# Patient Record
Sex: Male | Born: 1965 | Race: White | Hispanic: No | Marital: Married
Health system: Midwestern US, Community
[De-identification: ages and names within clinical notes are randomized; demographics above are authoritative.]

## PROBLEM LIST (undated history)

## (undated) DIAGNOSIS — M25561 Pain in right knee: Secondary | ICD-10-CM

---

## 2019-09-23 IMAGING — CR XR CERVICAL SPINE COMPLETE 4-5 VIEWS
6 series · 6 of 6 positions shown · non-contrast
Comparison: none

EXAM:  CERVICAL SPINE:
CLINICAL INDICATION:  Cervicalgia
REFERENCE: [id number]

[left lateral (1 of 2)]
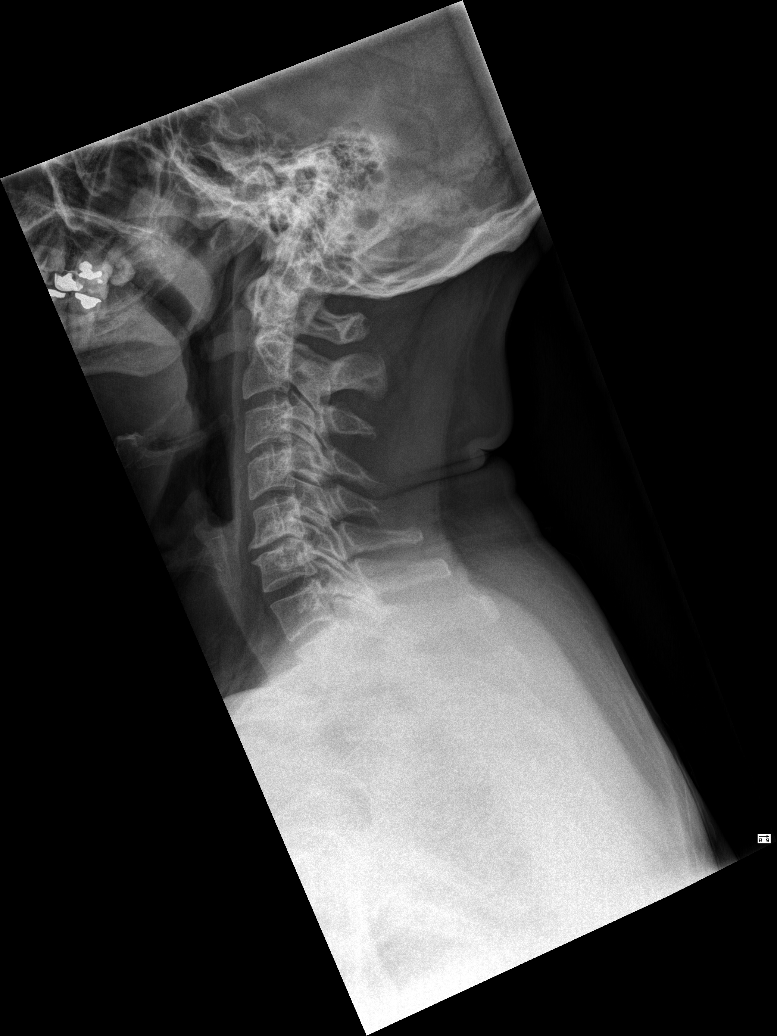

[left lateral (2 of 2)]
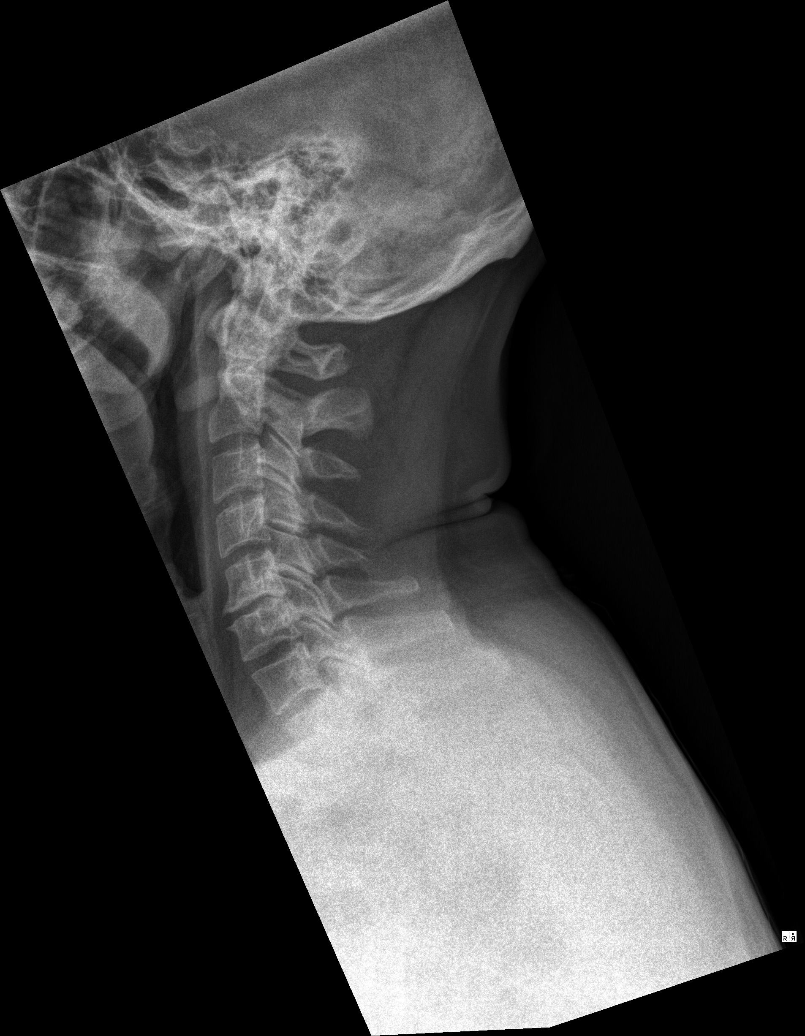

[AP (1 of 2)]
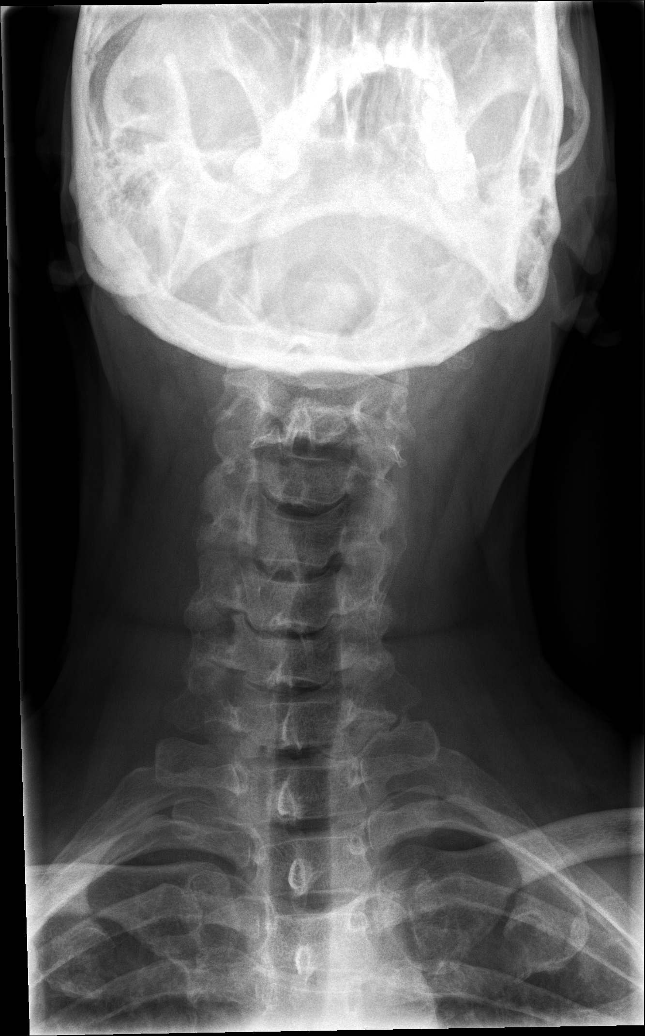

[rlo (1 of 2)]
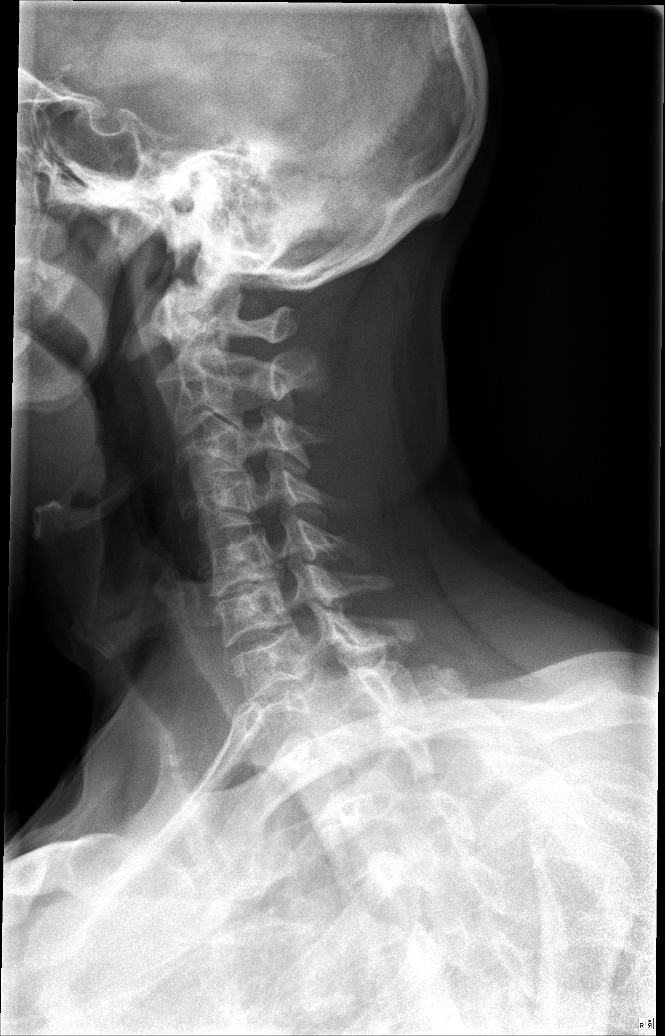

[rlo (2 of 2)]
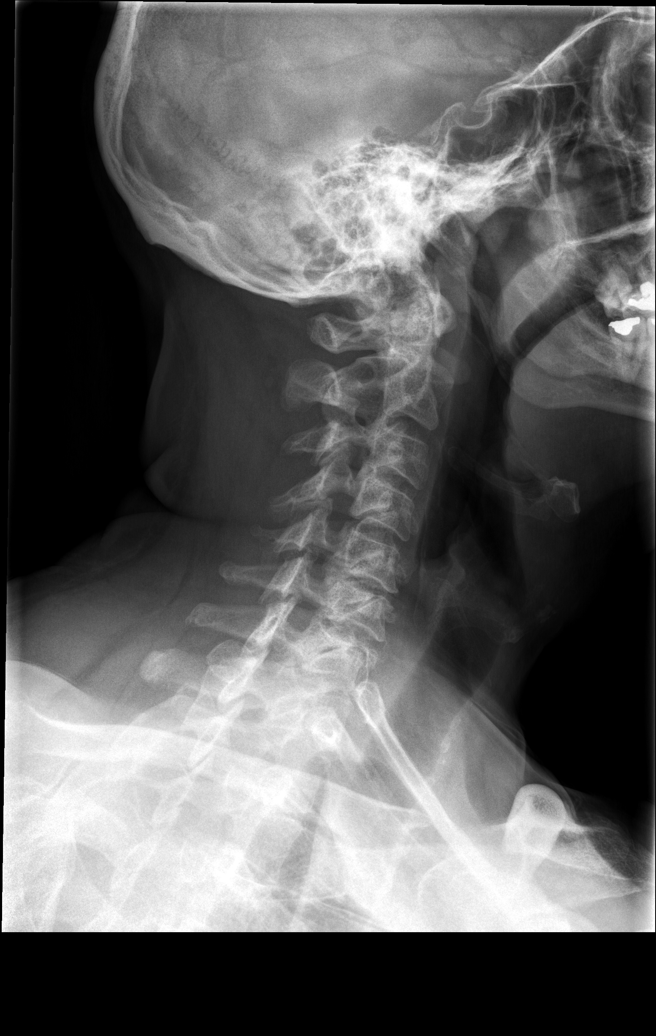

[AP (2 of 2)]
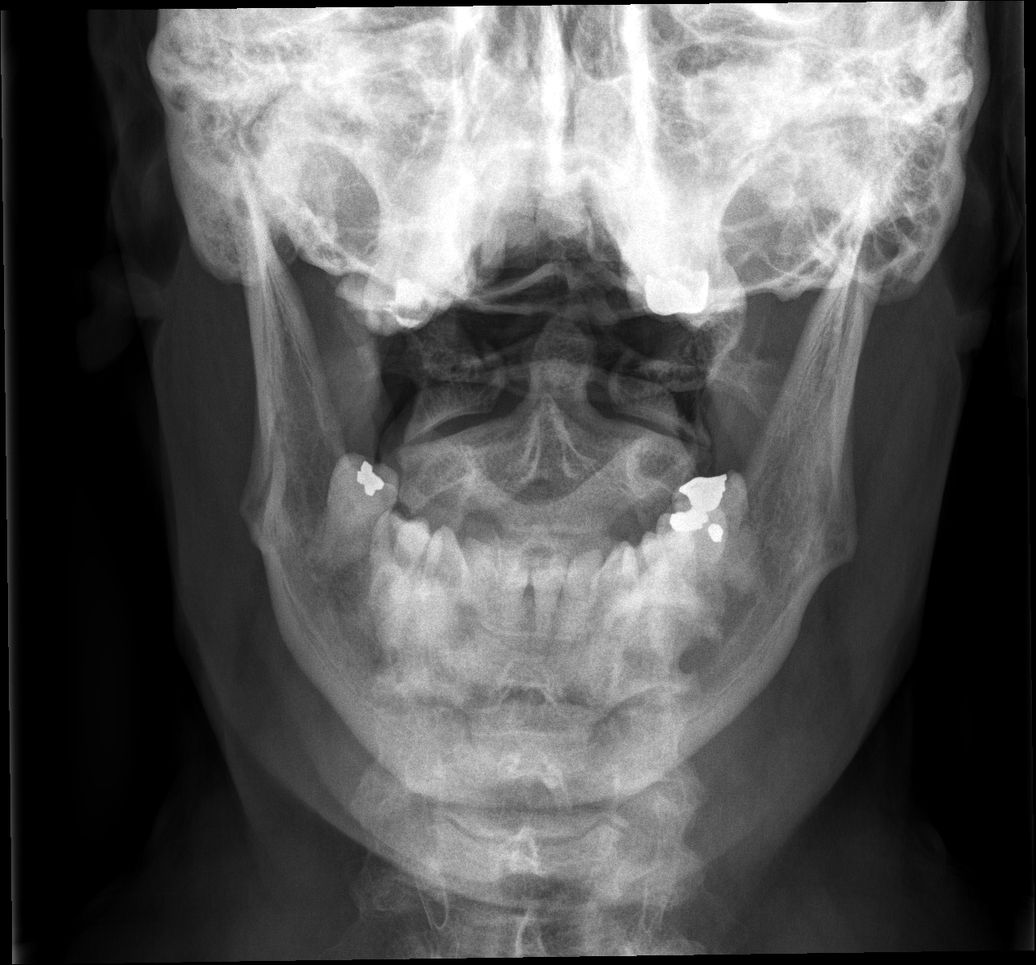

[6 of 6 positions shown; findings below may reference images not displayed]

FINDINGS: 5 Radiographs of the cervical spine demonstrate:
Mild cervicothoracic dextroscoliotic curvature. Mild to moderate C5-C6 disc height loss with osteophyte formation. Mild right greater than left C5-C6 osseous neural foraminal narrowing.
No acute fracture or traumatic listhesis is identified.  z
Prevertebral soft tissues are unremarkable with respect to size and contour.  The C1-C2 articulation is intact and symmetric.  The dens is unremarkable.
IMPRESSION: Degenerative changes with no superimposed acute process identified.  If symptoms persist, recommend correlation with cross-sectional imaging; CT or MRI.
LOCATION CODE: 1

## 2020-06-07 IMAGING — CR XR FOOT 3+ VIEWS LEFT
1 series · 4 of 4 positions shown · non-contrast
Comparison: none

EXAM:  LEFT FOOT:
CLINICAL INDICATION: pain

[Series 1: llo · left · 0.14mm/px · 4 of 4 slices shown]
[im 1/4]
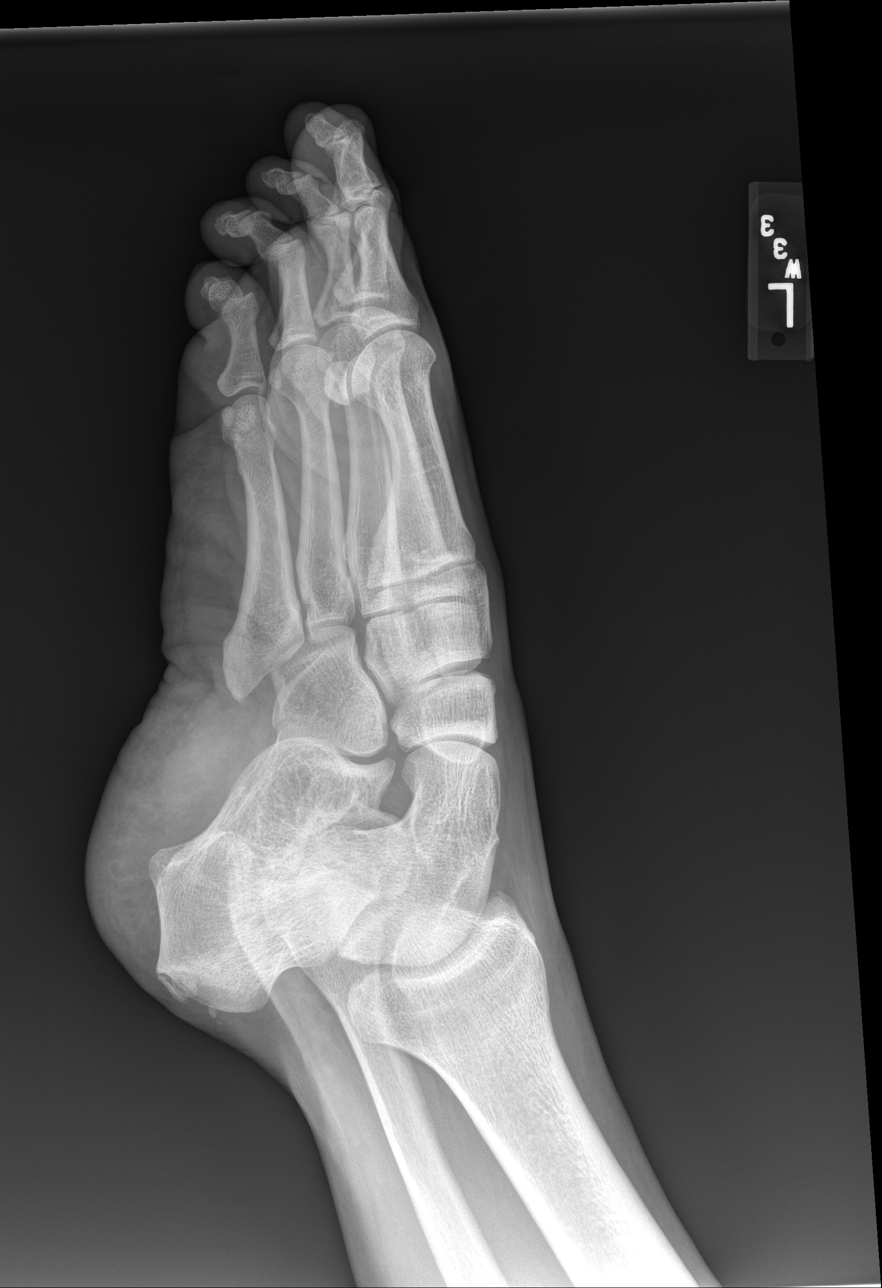
[im 2/4]
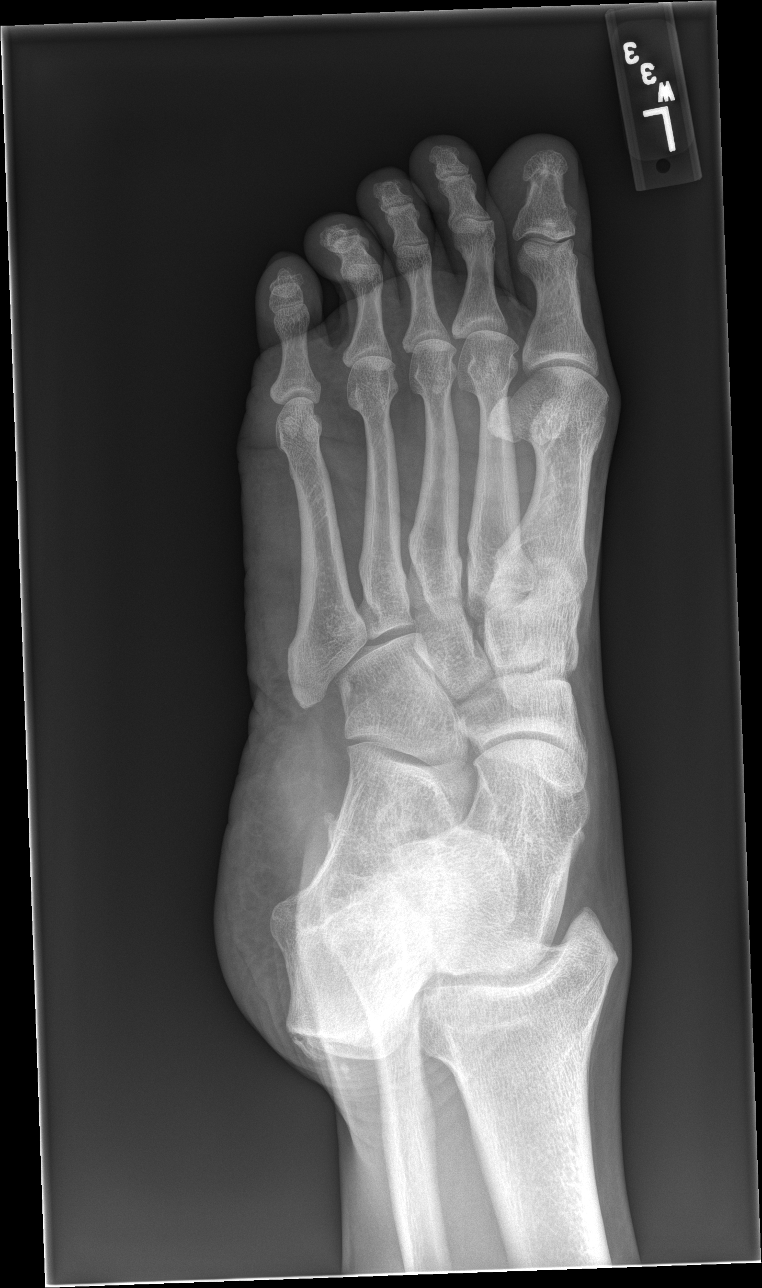
[im 3/4]
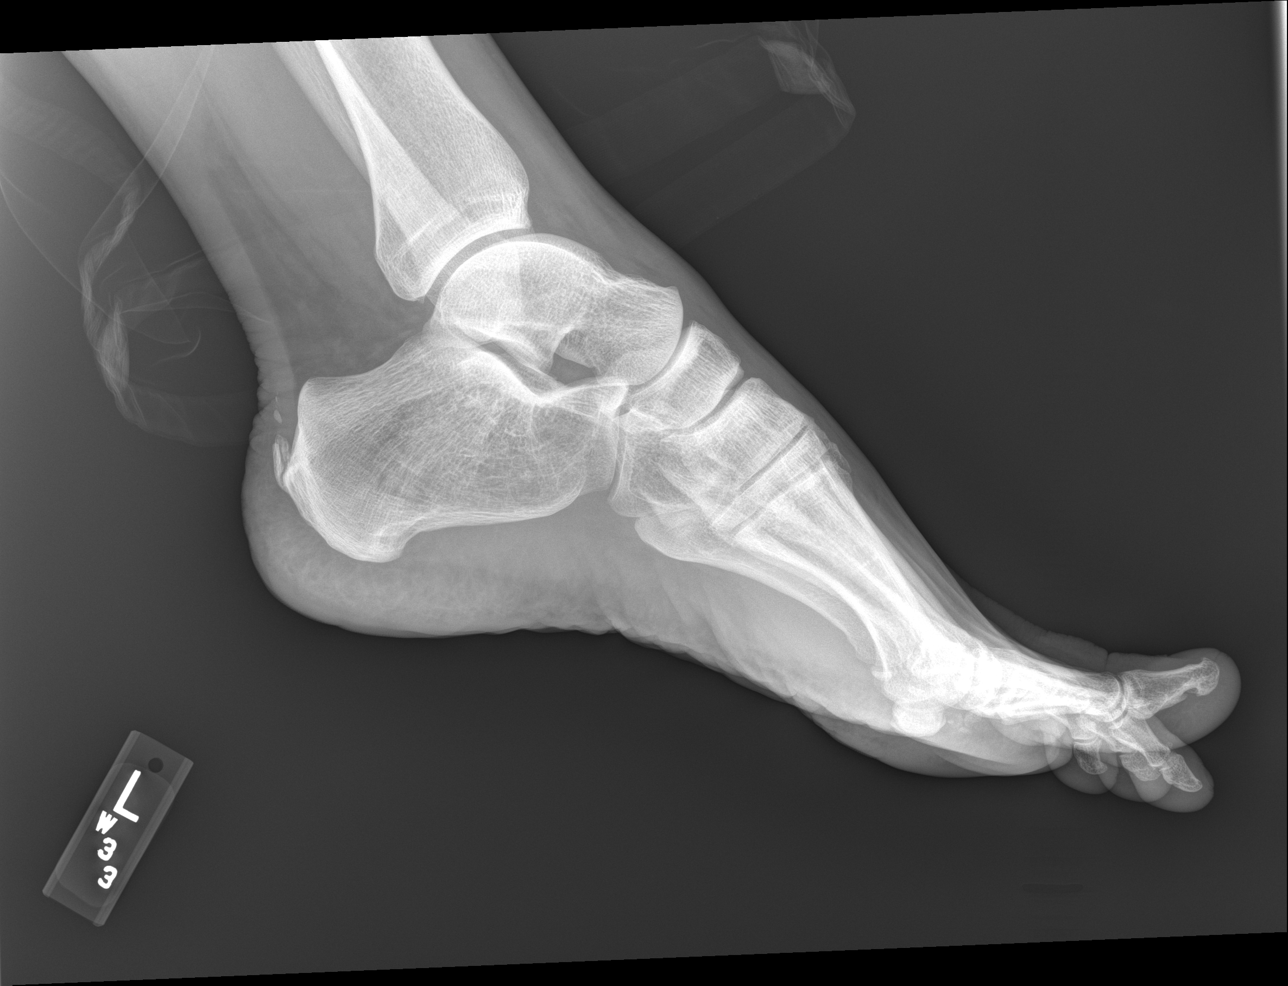
[im 4/4]
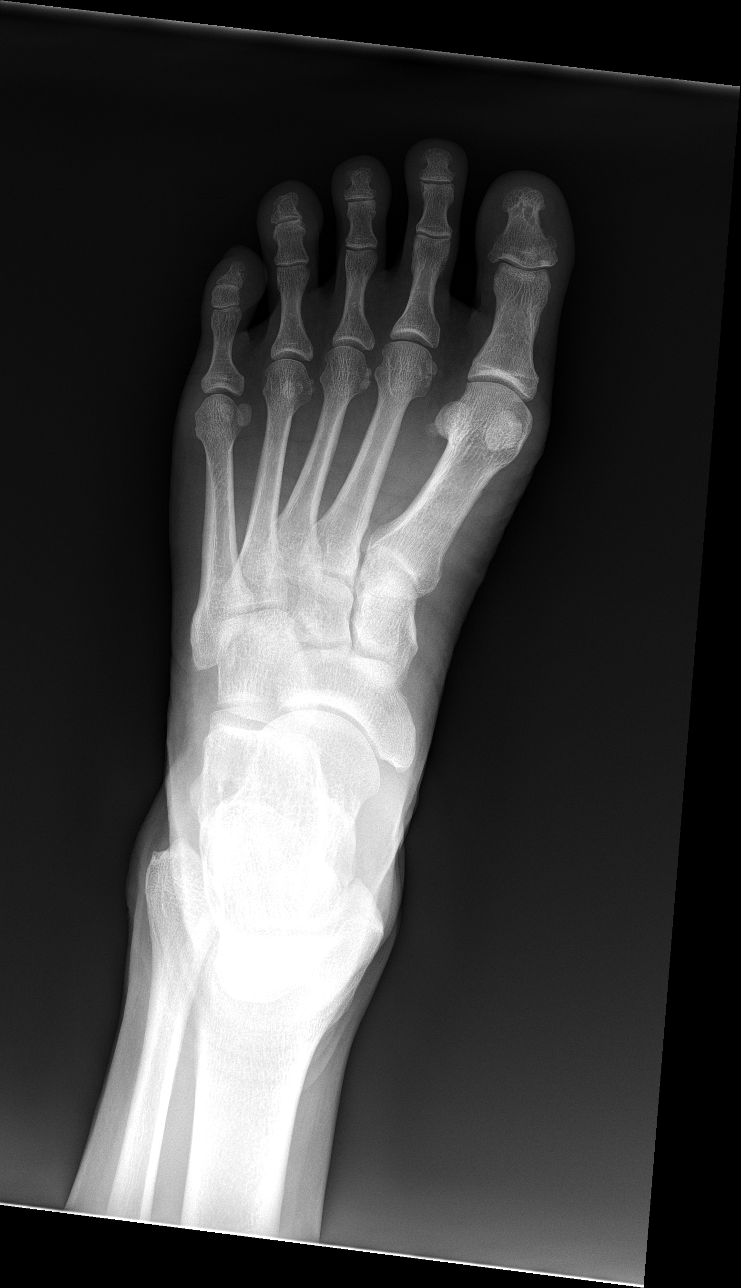

[4 of 4 positions shown; findings below may reference images not displayed]

FINDINGS: 3 radiographs of the Left foot demonstrate normally aligned and intact osseous structures.  No soft tissue abnormality or definite radiopaque foreign body is demonstrated. Posterior calcaneal spurring.
IMPRESSION: No acute findings detected.
LOCATION CODE: 1

## 2020-06-07 IMAGING — CR XR CALCANEUS 2 VIEWS LEFT
1 series · 2 of 2 positions shown · non-contrast
Comparison: none

Heel pain ,s/p cyst removal as child with donor bone
EXAM: LEFT CALCANEUS
CLINICAL INDICATION: pain

[Series 1: left lateral · left · 0.14mm/px · 2 of 2 slices shown]
[im 1/2]
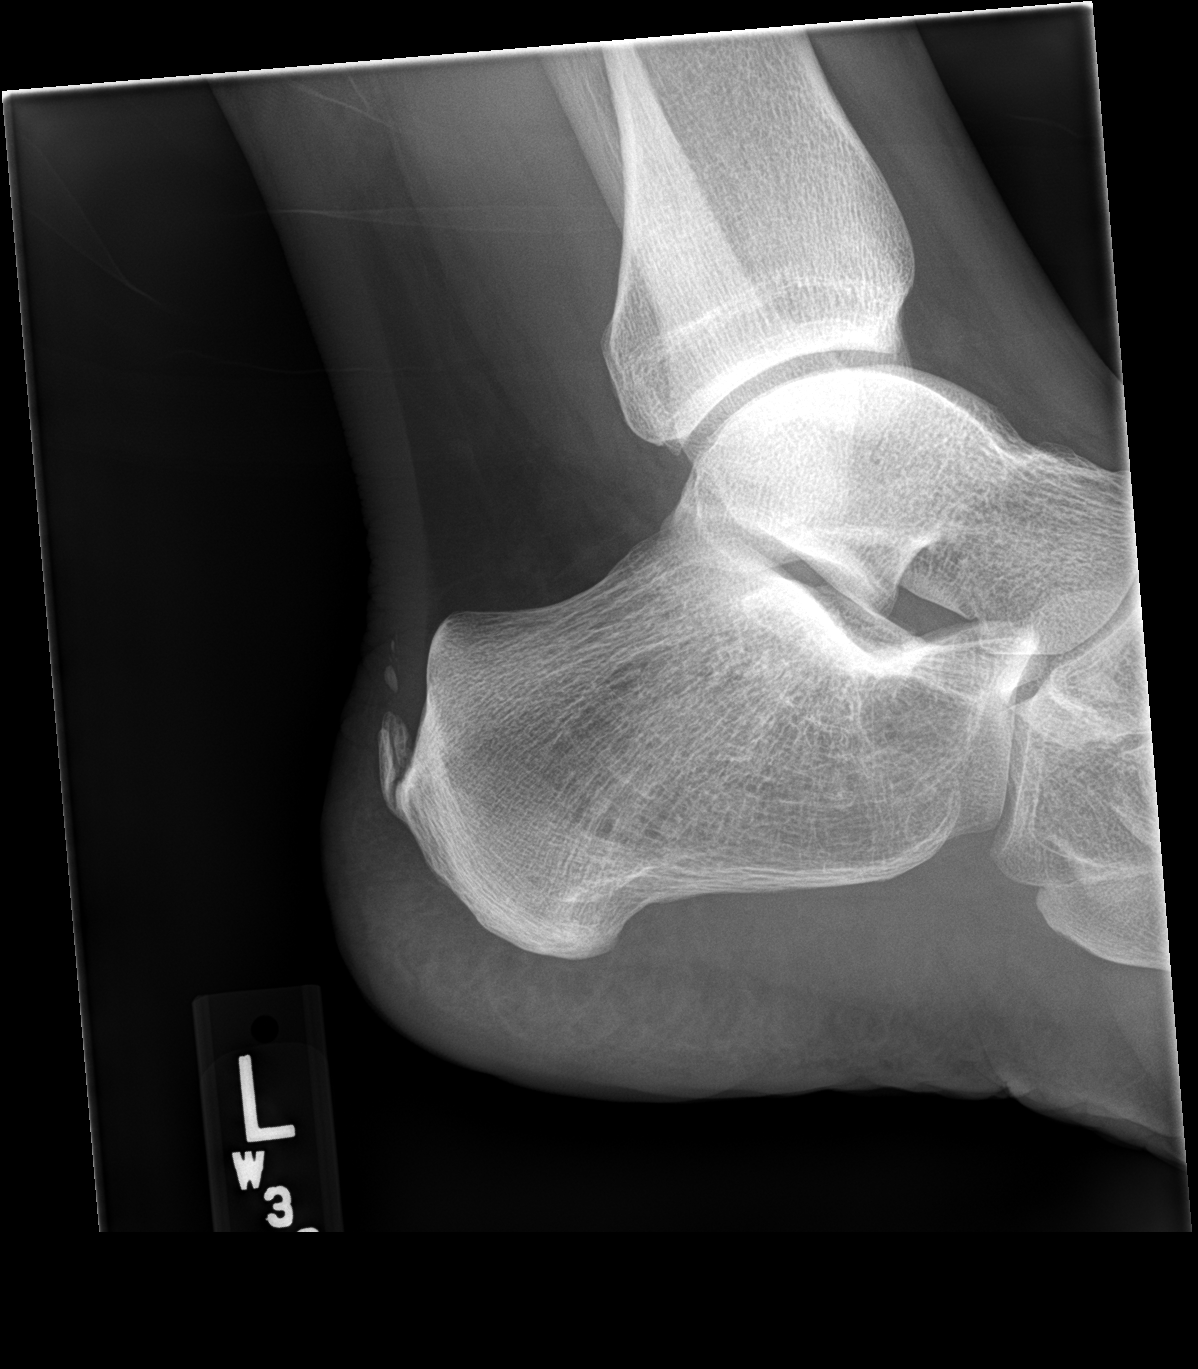
[im 2/2]
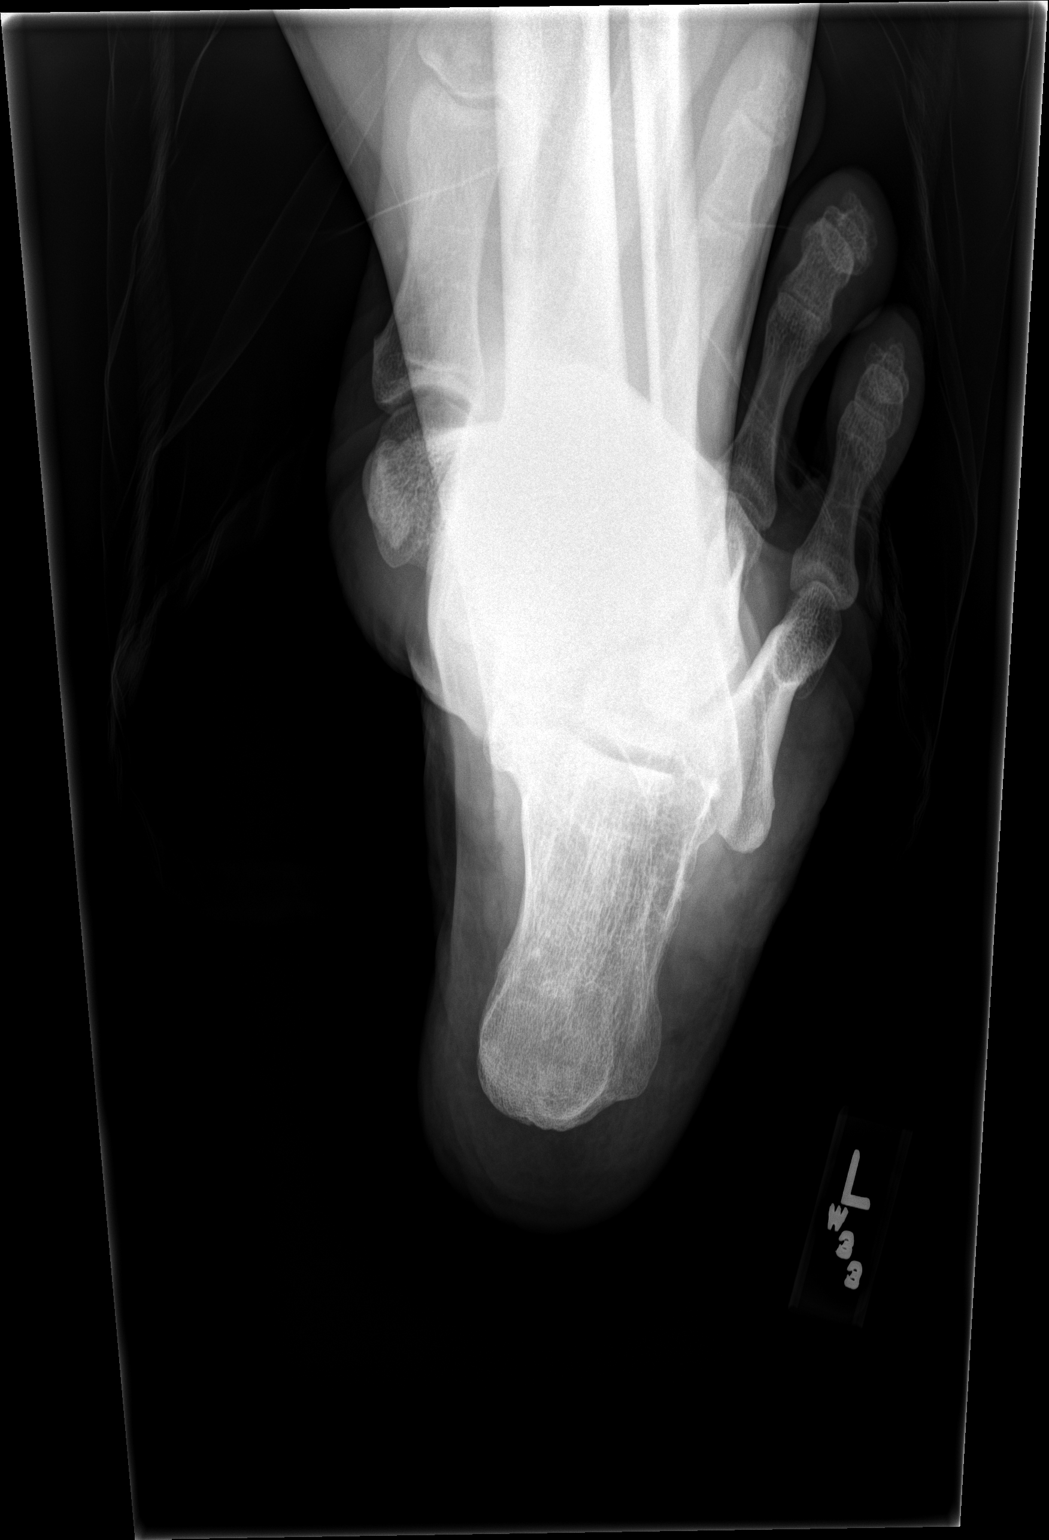

[2 of 2 positions shown; findings below may reference images not displayed]

FINDINGS: Two views of the calcaneus were obtained.  Bony structures appear intact and normally aligned.   No soft tissue gas or foreign body is identified. Posterior calcaneal spurring and calcification within the Achilles tendon.
IMPRESSION: No acute findings detected.
Posterior calcaneal spurring and calcification within the Achilles tendon concerning for chronic tendinitis.
LOCATION CODE: 1

## 2020-12-28 ENCOUNTER — Encounter

## 2020-12-29 ENCOUNTER — Encounter: Admit: 2020-12-29 | Discharge: 2020-12-29 | Payer: PRIVATE HEALTH INSURANCE | Primary: Sports Medicine

## 2020-12-29 ENCOUNTER — Ambulatory Visit
Admit: 2020-12-29 | Discharge: 2020-12-29 | Payer: PRIVATE HEALTH INSURANCE | Attending: Orthopaedic Surgery | Primary: Sports Medicine

## 2020-12-29 DIAGNOSIS — S83241A Other tear of medial meniscus, current injury, right knee, initial encounter: Secondary | ICD-10-CM

## 2020-12-29 NOTE — Progress Notes (Signed)
[facility]  [facility] [place]   [address]  [address]  [place] [address]  Dept: [phone]  Loc: [phone]        FEDERICK LEVENE  July 01, 1965  [id number]  12/29/2020      Chief Complaint   Patient presents with    New Patient     Right knee pain       HPI:  Nathaniel Collins is a 55 y.o. male who is here today for evaluation of his right knee. Their symptoms started  about 4 months ago.  The current symptoms started after no specific injury, noted pain the day after running on the treadmill.  He reports he had a similar issue on his left side but resolved after 1 month.  He reports that he did not run for 6 months prior to starting the treadmill in April which may have contributed to it.  He reports that most of his activities are getting better other than trialing running.    The patient complains of pain located diffuse, stiffness, and swelling.    Activities that make the pain worse include running and driving . The patient has tried rest/ice/elevation/activity modification with some relief. The patient reports no previous surgery related to the injured knee.     The patient is retired.    Tico is referred by Reatha Harps, MD.     SANE score: 60    No Known Allergies  Current Outpatient Medications   Medication Sig Dispense Refill    quinapril (ACCUPRIL) 10 MG tablet TAKE 1 TABLET BY MOUTH EVERY DAY FOR 90 DAYS       No current facility-administered medications for this visit.     Past Medical History:   Diagnosis Date    High blood pressure      History reviewed. No pertinent surgical history.  Social History     Socioeconomic History    Marital status: Married     Spouse name: Not on file    Number of children: Not on file    Years of education: Not on file    Highest education level: Not on file   Occupational History    Not on file   Tobacco Use    Smoking status: Never    Smokeless tobacco: Never   Substance and Sexual Activity    Alcohol use: Not on  file    Drug use: Not on file    Sexual activity: Not on file   Other Topics Concern    Not on file   Social History Narrative    Not on file     Social Determinants of Health     Financial Resource Strain: Not on file   Food Insecurity: Not on file   Transportation Needs: Not on file   Physical Activity: Not on file   Stress: Not on file   Social Connections: Not on file   Intimate Partner Violence: Not on file   Housing Stability: Not on file     History reviewed. No pertinent family history.      Physical Exam:    BP 136/88    Temp 97.4 ??F (36.3 ??C)    Ht 6\' 3"  (1.905 m)    Wt 252 lb (114.3 kg)    BMI 31.50 kg/m??     MUSCULOSKELETAL:  Hip Exam:  Ipsilateral hip exam reveals normal and symmetric range of motion with no pain elicited with internal  and external range of  motion.    Lumbar Exam:  Full painless flexion, extension, and rotation with negative same side and contralateral straight leg raise.     Right Knee:  Palpation reveals tenderness over the medial joint line, lateral joint line, and patellar tendon  Non tender with palpation over the remaining bony prominences or palpable structures around the knee.  Negative homans sign at the calf.     Patellofemoral exam reveals mild crepitus with patellar compression and knee ROM and no significant pain or apprehension with patellar compression or manipulation    Inspection of the knee reveals no obvious deformity or swelling, no obvious skin lesion, erythema, or discoloration.  Mild effusion. Neutral alignment. Scars include none.      ROM:   RIGHT  LEFT   Extension AROM Full  PROM Same AROM Full  PROM Same   Flexion AROM 130??  PROM Same AROM 130??  PROM Same       Sensation:  Sensation intact over the medial and anterior thigh, medial and posterolateral calf, dorsal and plantar foot.     Pulse:  Palpable DP pulse.      Reflexes:  2+ patellar and achilles reflexes.     Motor:  Motor is 5/5 with hip flexion, thigh adduction, knee extension, flexion, ankle dorsi-  and plantarflexion and toe extension flexion without evidence of quadricep atrophy compared to the contralateral side.    Ligamentous Examination:  ACL testing reveals negative lachman with firm endpoint. Negative pivot shift.  PCL testing reveals negative posterior drawer testing.    MCL testing reveals negative valgus stress with firm endpoint at 0 and 30 degrees.   LCL testing reveals negative varus stress with firm endpoint at 0 and 30 degrees.    PLC (Posterio-lateral corner) testing reveals negative varus stress with firm endpoint at 0 and 30 degrees.     Special Tests (as indicated):  Pain with hyperextension: negative  Pain with hyperflexion: negative    McMurray's test: No significant pain elicited with mcmurray testing        Thessaly test (Standing wt-bearing pivoting): positive                    The contralateral knee is negative for any pertinent positives and was examined for a baseline to compare to the injured side.       Imaging:    Knee Xrays:   Plain films were taken today and reviewed in office.  Weight bearing 4 views including AP, and PA flex views of bilateral knees, lateral and sunrise views of the affected knee(s) show narrowing in the patellofemoral compartment, but sparing the medial and lateral compartments without associated sclerosis and osteophyte formation in affected compartments noted above. No evidence of fracture or other osseous abnormality. No evidence of obvious malignant bony lesions.     Other Diagnostic Testing:   None      IMPRESSION:      ICD-10-CM    1. Tear of medial meniscus of right knee, current, initial encounter  S83.241A       2. Arthritis of right knee  M17.11           PROCEDURE:    None    PLAN:  The patients history, exam, and imaging is consistent with right knee pain likely secondary to mild patellofemoral arthritis or potential small medial meniscus tear.  He reports that he started having pain in April and has resolved for the most part other  than with  running.  He typically runs for 5 to 25 minutes on a treadmill.  He reports his location of pain can somewhat move around.  He does have some mild medial joint tenderness and positive Thessaly on exam today.  I went over typical treatment options for this.  As he is feeling somewhat better, we agreed on trialing gradual increase in activities and potentially even crosstraining with activities other than running.  If he has worsening issues like he did and April, he will call and we will likely discuss potential cortisone injection versus MRI.  All his questions were answered otherwise.    I discussed with Brodey the natural history, expected outcome, and risks/benefits of both operative and nonoperative management of his particular diagnosis relative to his age, activity level, previous treatment, and physical exam.  Toshiro had some excellent questions, all of which were answered to his satisfaction.    Keanu elected to proceed with gradual increase in activities.    Follow-up:  Sargon will followup with me on an as needed basis.  He  knows to call the office with any questions or concerns in the interim.    Future Imaging:  NONE        This encounter was considered low MDM due to 1 acute uncomplicated injury.  I recommended tylenol or NSAIDs as needed for pain or swelling unless they have been instructed to avoid previously.       Jefm Bryant, MD  Orthopaedic Sports Medicine  [facility]  Department of Orthopaedics and Sports Medicine  12/29/2020 at 3:34 PM     (Please note that portions of this note may have been completed with a voice recognition program. Efforts were made to edit the dictations but occasionally words are mis-transcribed.)

## 2021-02-22 IMAGING — MR MRI BRAIN WITH AND WITHOUT CONTRAST
11 of 13 series · 38 of 48 positions shown · non-contrast
Comparison: None available

HEADACHES THAT START IN THE NECK AND GO UP TO HEAD
FINAL REPORT:
INDICATION: Chronic tension-type headache, not intractable Pain
Exam: MRI of the head with intravenous contrast.

[Series 2001: survey_mpr_sag · sagittal · 1.6mm · 1.60mm/px · 1 of 5 slices shown]
[im 1/5]
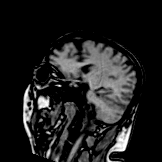

[Series 3001: survey_mpr_cor · coronal · 1.6mm · 1.60mm/px · 1 of 3 slices shown]
[im 1/3]
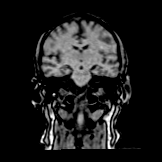

[Series 4001: survey_mpr_tra · axial · 1.6mm · 1.60mm/px · 1 of 3 slices shown]
[im 1/3]
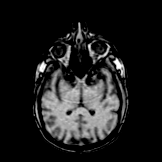

[Series 5001: T1 · sagittal · 5.0mm · 0.72mm/px · 3 of 24 slices shown (1 of 2)]
[im 1/24]
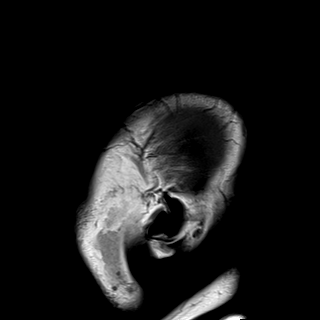
[im 12/24]
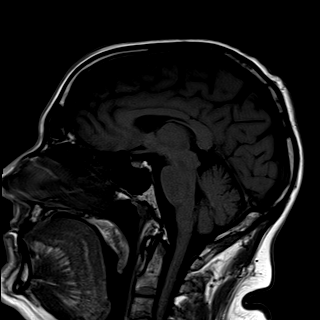
[im 24/24]
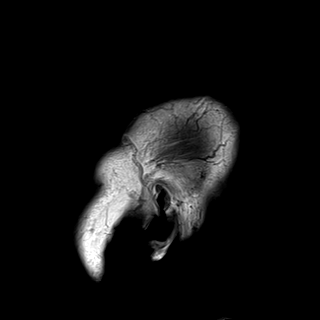

[Series 6002: ax dwi_tracew · axial · 4.0mm · 0.60mm/px · z∈[-91,+13]mm · 3 of 33 slices shown]
[im 1/33]
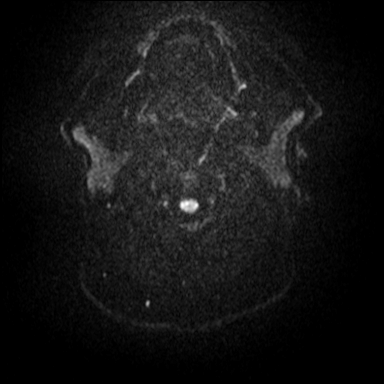
[im 11/33]
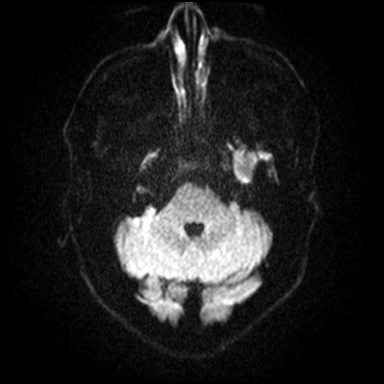
[im 22/33]
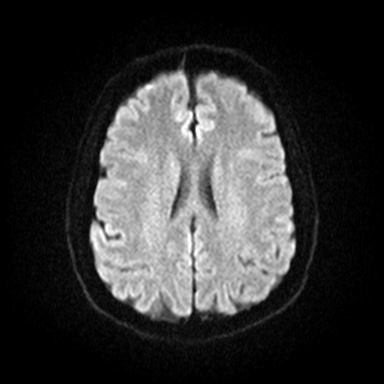

[Series 9001: T2 · axial · 4.0mm · 0.53mm/px · z∈[-91,+67]mm · 5 of 33 slices shown]
[im 1/33]
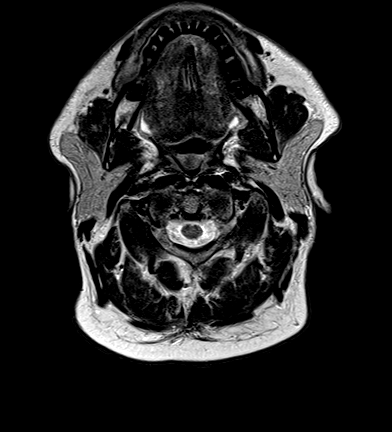
[im 9/33]
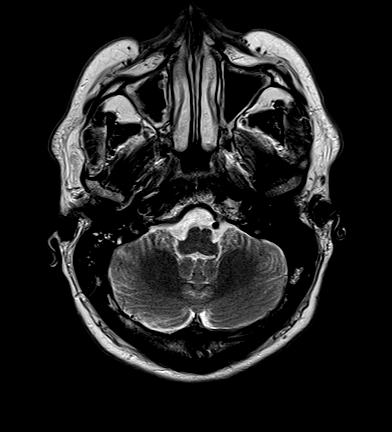
[im 17/33]
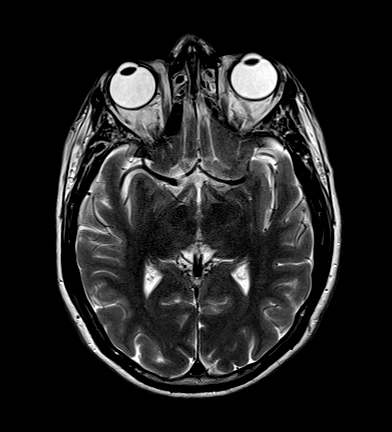
[im 25/33]
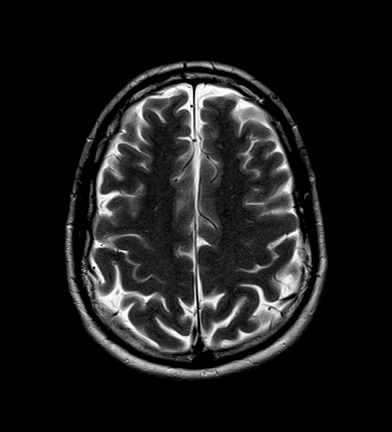
[im 33/33]
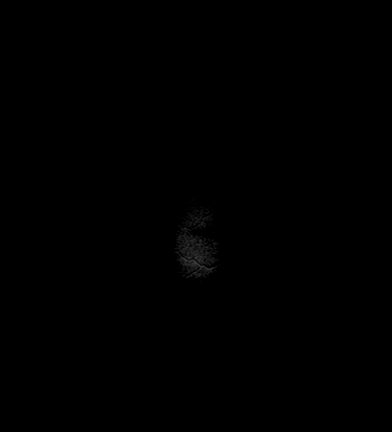

[T1 · axial · 4.0mm · 0.72mm/px · z∈[-91,+67]mm · 5 of 33 slices shown (2 of 2)]
[im 1/33]
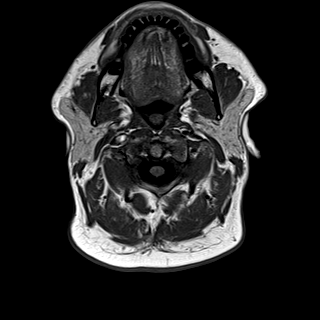
[im 9/33]
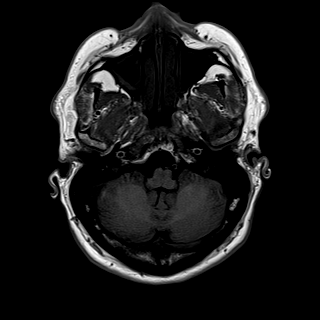
[im 17/33]
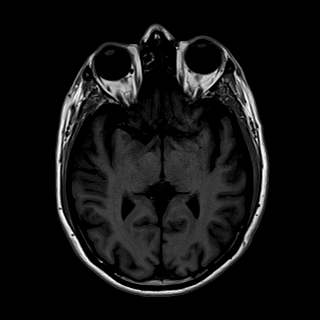
[im 25/33]
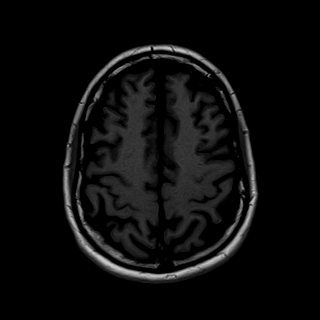
[im 33/33]
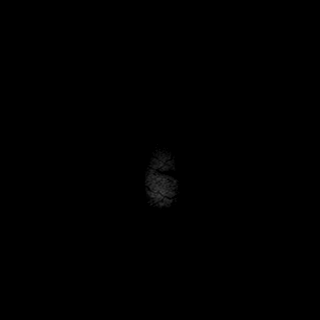

[FLAIR · axial · 4.0mm · 0.72mm/px · z∈[-91,+67]mm · 5 of 33 slices shown]
[im 1/33]
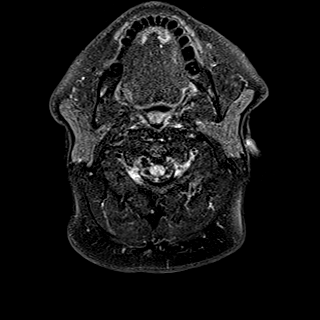
[im 9/33]
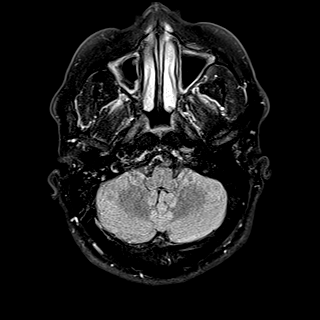
[im 17/33]
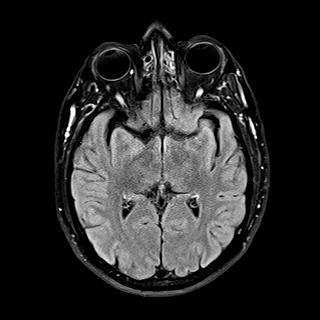
[im 25/33]
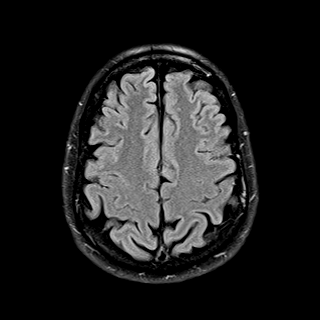
[im 33/33]
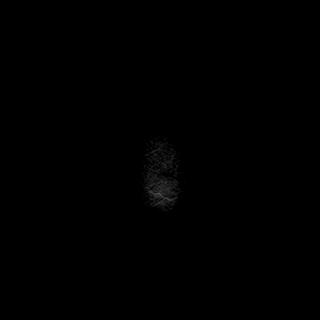

[GRE · axial · 4.0mm · 0.45mm/px · z∈[-91,+67]mm · 5 of 33 slices shown]
[im 1/33]
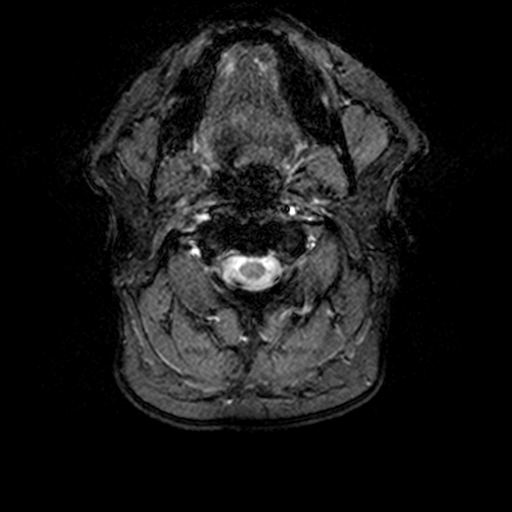
[im 9/33]
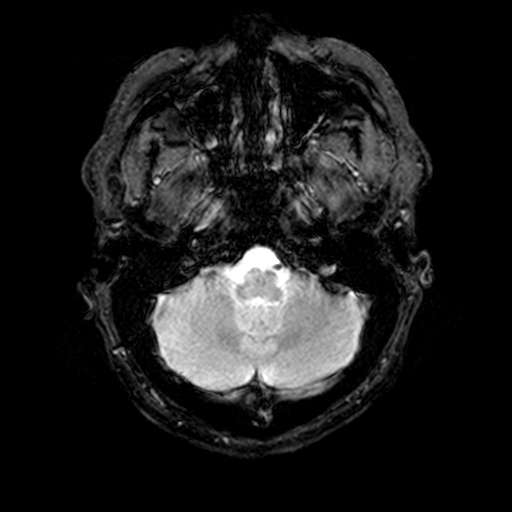
[im 17/33]
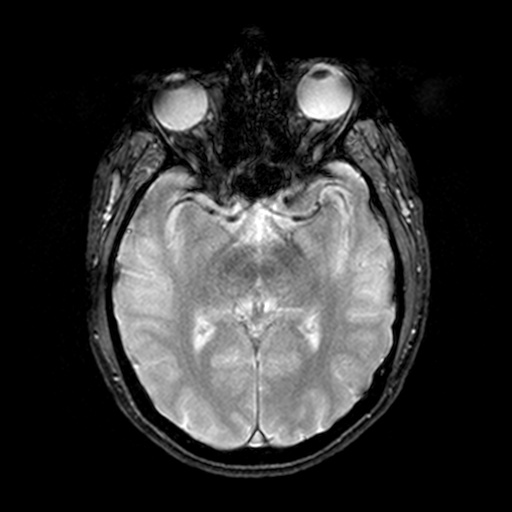
[im 25/33]
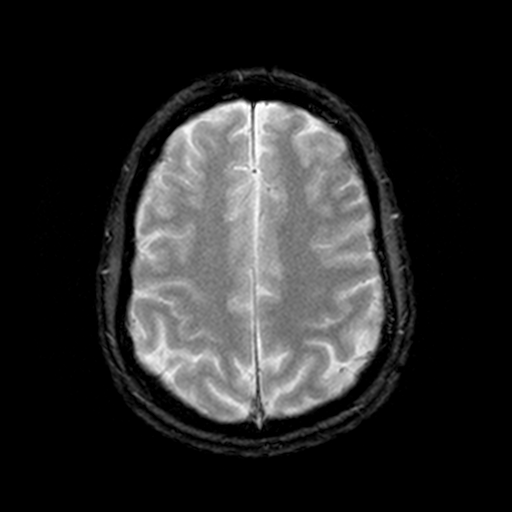
[im 33/33]
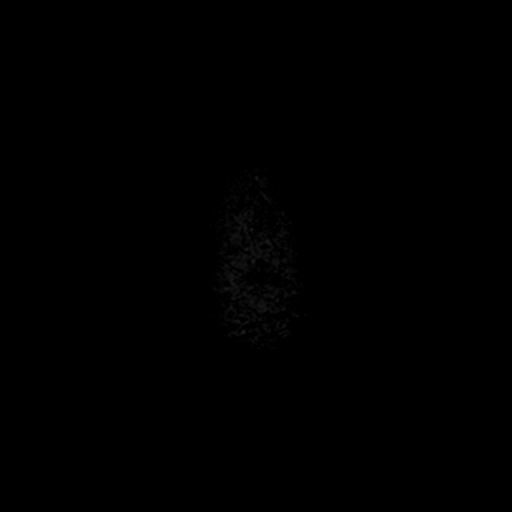

[T1 post-contrast · axial · 4.0mm · 0.72mm/px · z∈[-91,+67]mm · 5 of 33 slices shown (1 of 2)]
[im 1/33]
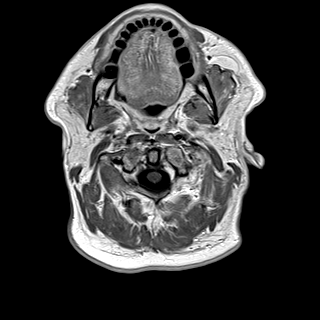
[im 9/33]
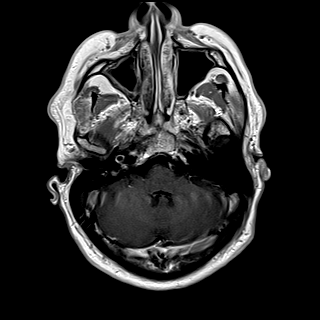
[im 17/33]
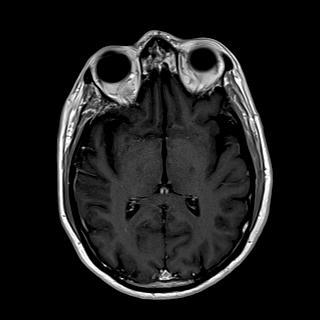
[im 25/33]
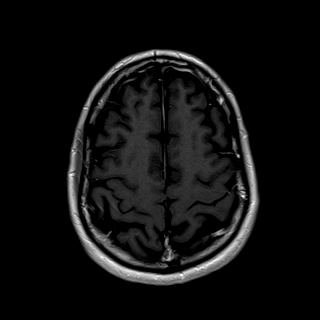
[im 33/33]
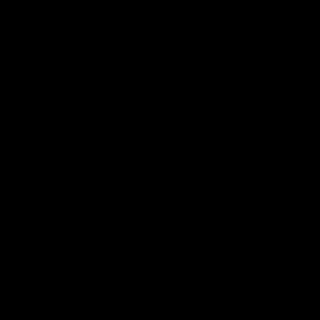

[T1 post-contrast · coronal · 5.0mm · 0.72mm/px · 4 of 28 slices shown (2 of 2)]
[im 1/28]
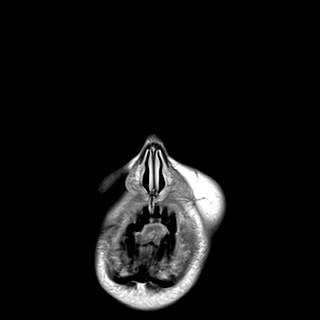
[im 10/28]
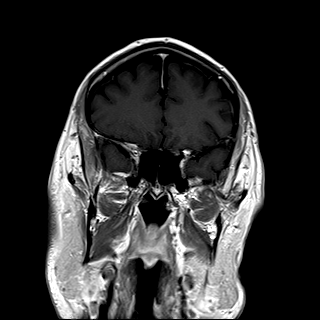
[im 19/28]
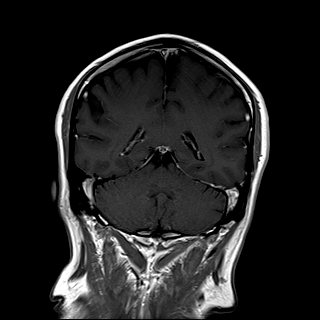
[im 28/28]
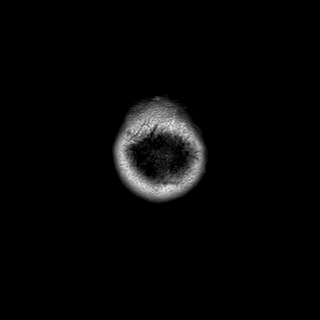

[38 of 48 positions shown; findings below may reference images not displayed]

FINDINGS: No midline shift. Basal cisterns are patent.  Ventricles are normal and symmetric for patient's age.
No evidence for acute infarct, intracranial hemorrhage or mass lesion. No extra-axial fluid collections.
No abnormal enhancing lesions following contrast administration.
Intracranial vascular flow voids are normal.
Orbital contents are unremarkable.
There is mucosal thickening of bilateral maxillary sinuses, the right frontal sinus, and partial opacification of the bilateral ethmoid air cells.
IMPRESSION: 1.  No acute intracranial abnormality.
2.  Paranasal sinus disease, nonspecific but could be seen in sinusitis.

## 2022-02-07 IMAGING — DX XR CHEST 1 VIEW
1 series · 2 of 2 positions shown · non-contrast
Comparison: 06/18/2017

CHEST PAIN
FINAL REPORT:
XR CHEST 1 VIEW
INDICATION: Chest pain.
TECHNIQUE: AP views of the chest.

[Series 6050: AP · U · 0.10mm/px · 2 of 2 slices shown]
[im 1/2]
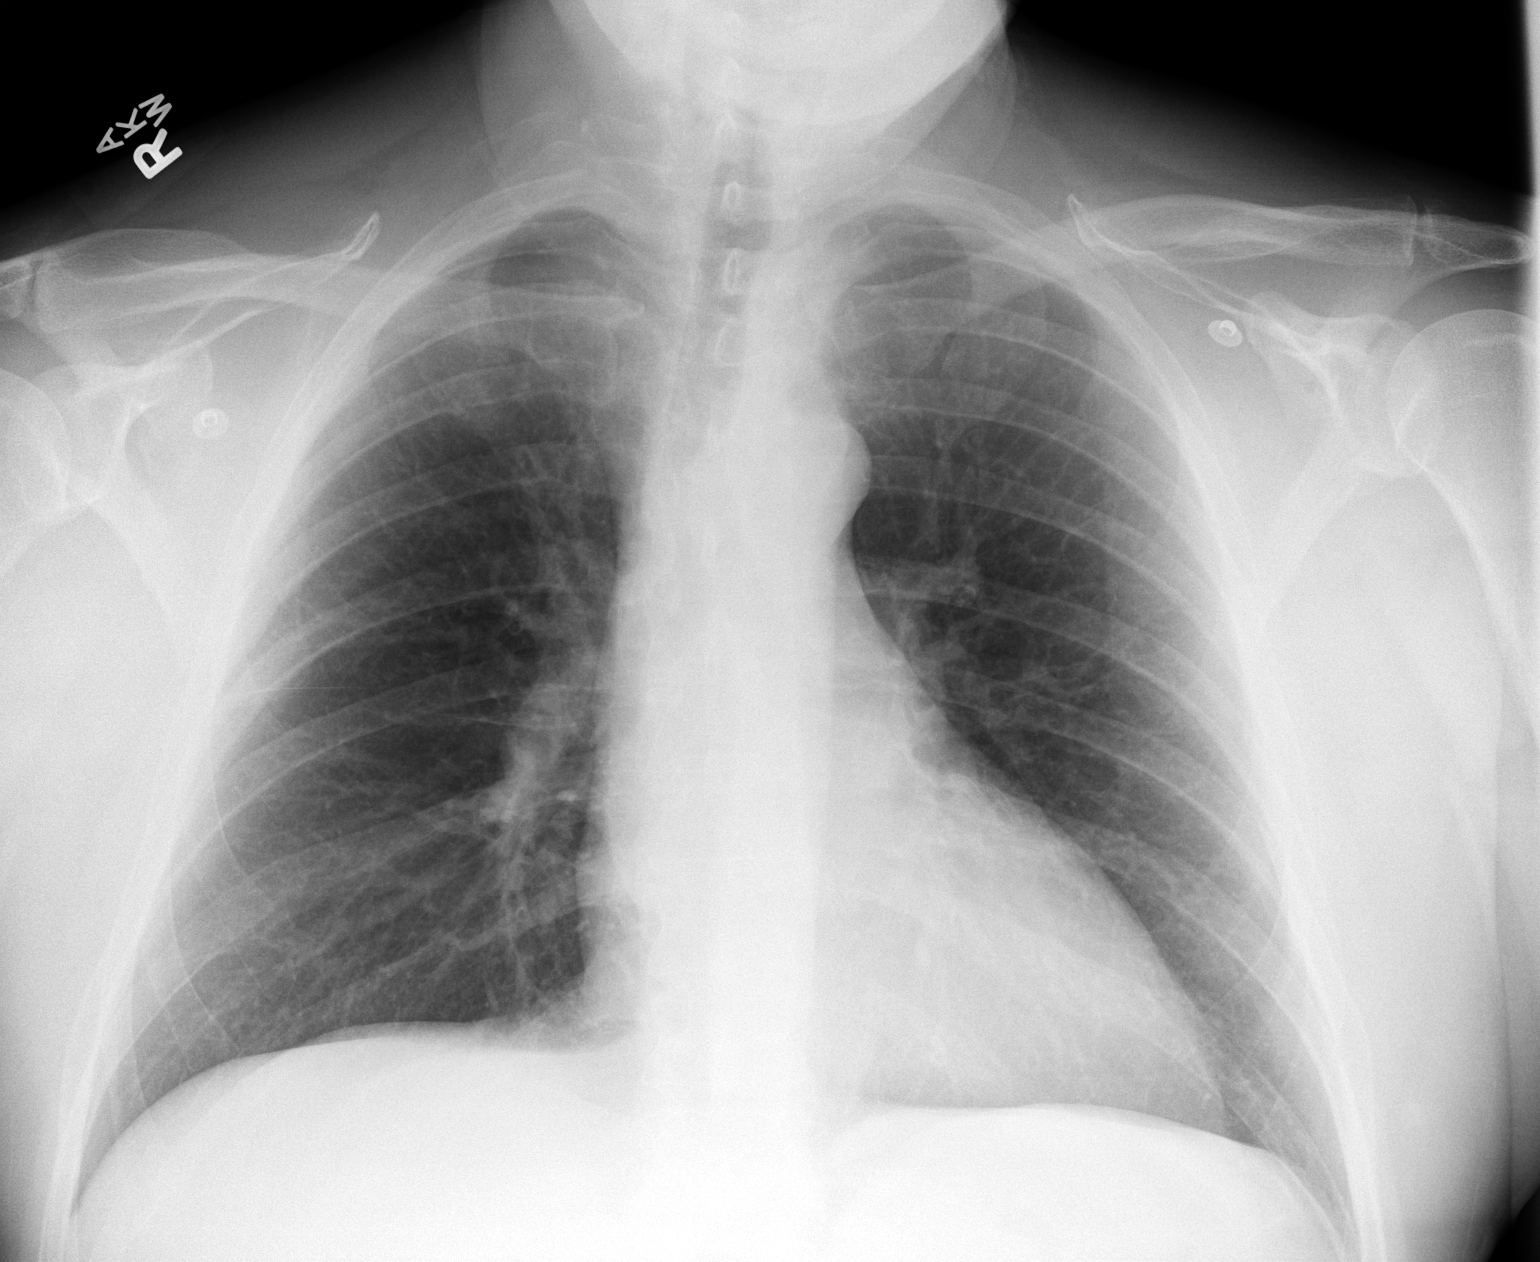
[im 2/2]
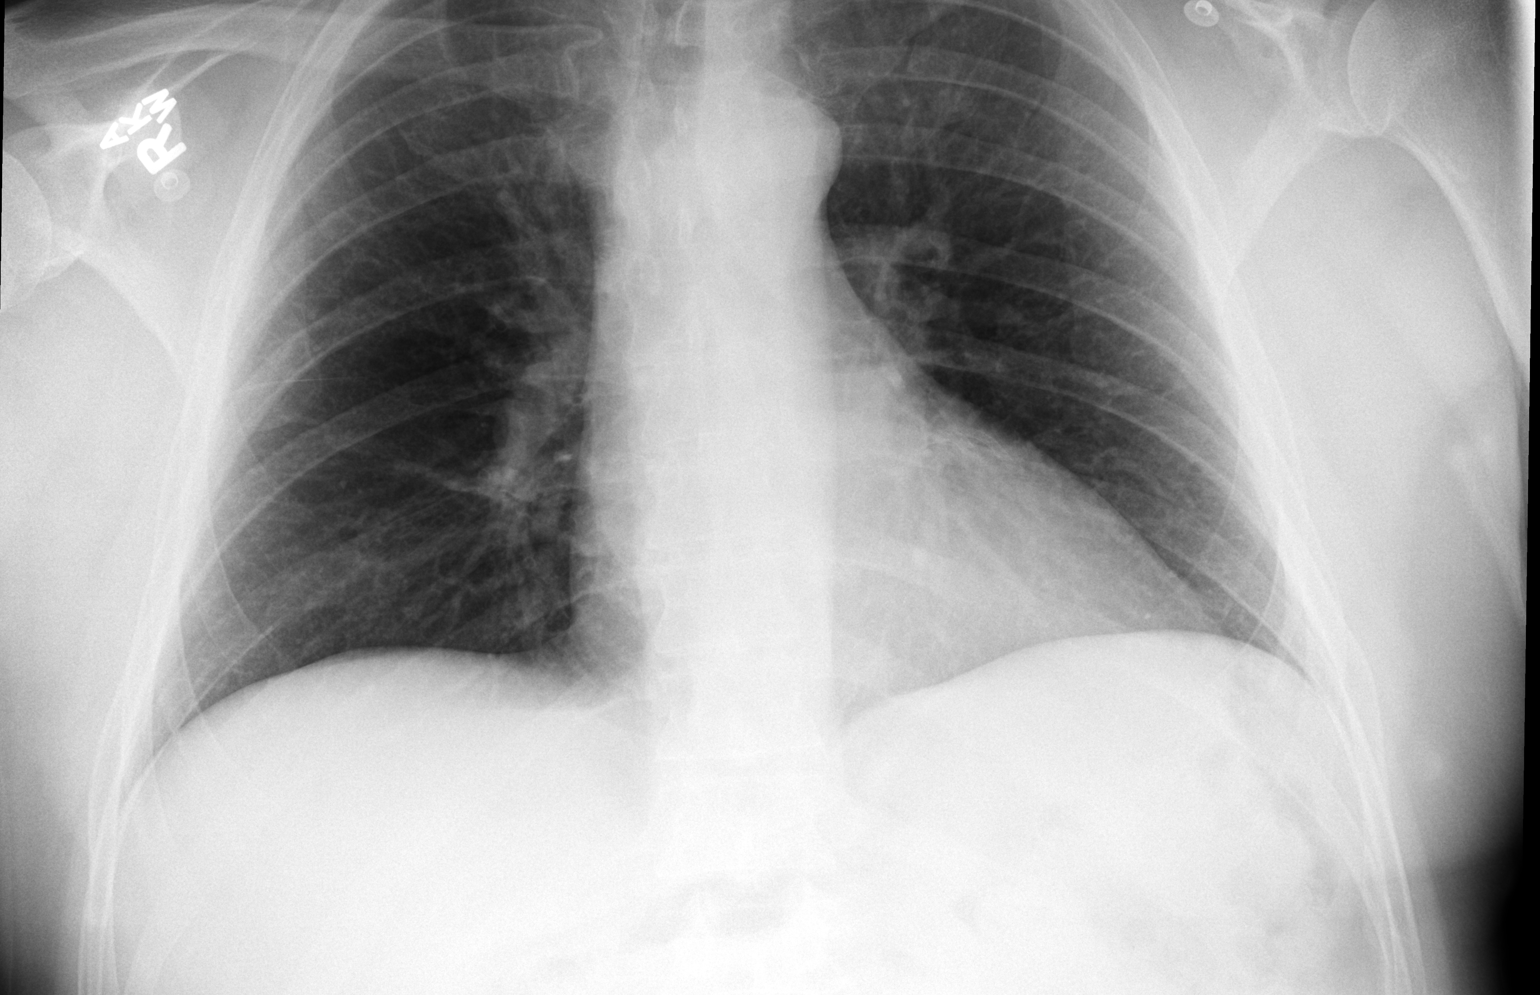

[2 of 2 positions shown; findings below may reference images not displayed]

FINDINGS: The cardiac silhouette is within normal limits. The mediastinum is in the midline. No consolidation or pulmonary edema. The costophrenic angles are sharp. No pneumothorax. The bones and soft tissues are unremarkable.
IMPRESSION: 
IMPRESSION: No radiographic evidence of an acute cardiopulmonary process.

## 2022-02-17 IMAGING — MR MRI CERVICAL SPINE WITHOUT CONTRAST
7 series · 48 of 48 positions shown · non-contrast
Comparison: None available

NECK AND SHOULDER PAIN
FINAL REPORT:
HISTORY: Neck and shoulder pain
Procedure: MRI of the cervical spine without contrast
TECHNIQUE: Multisequence, multiplanar imaging of the cervical spine was performed.

[Series 7001: survey_mpr_sag · sagittal · 1.7mm · 1.67mm/px · 5 of 15 slices shown]
[im 1/15]
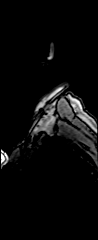
[im 4/15]
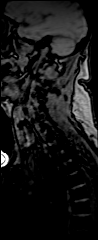
[im 8/15]
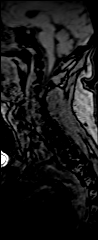
[im 11/15]
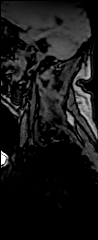
[im 15/15]
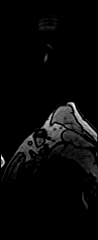

[Series 8001: survey_mpr_(person_name) · axial · 1.7mm · 1.67mm/px · z∈[-328,-28]mm · 2 of 7 slices shown]
[im 1/7]
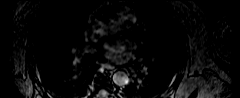
[im 7/7]
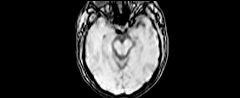

[Series 9001: T2 · sagittal · 3.0mm · 0.38mm/px · 5 of 15 slices shown (1 of 2)]
[im 1/15]
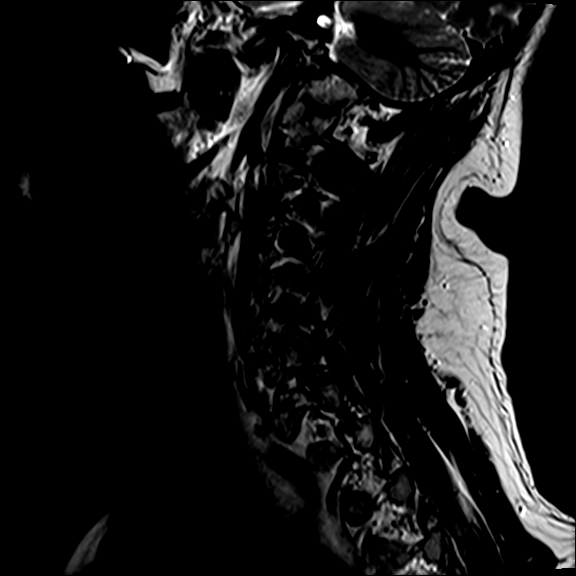
[im 4/15]
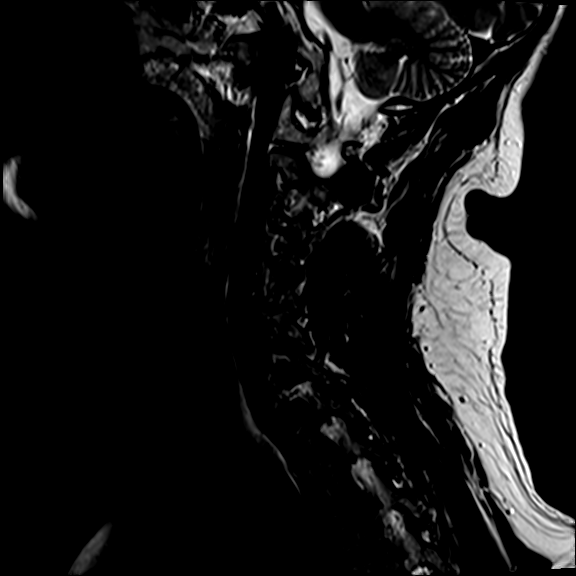
[im 8/15]
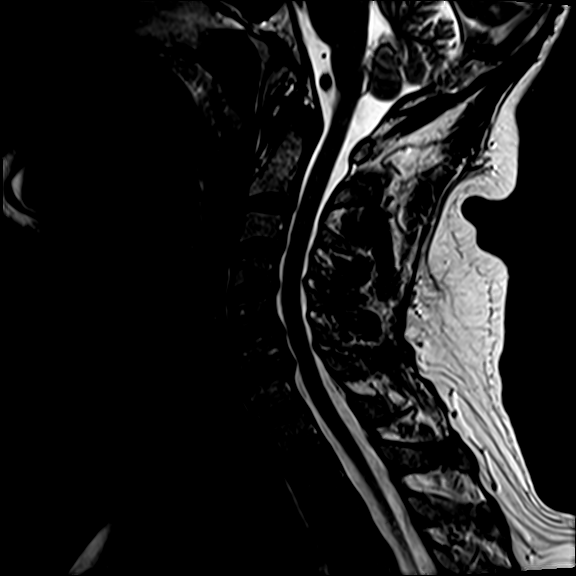
[im 11/15]
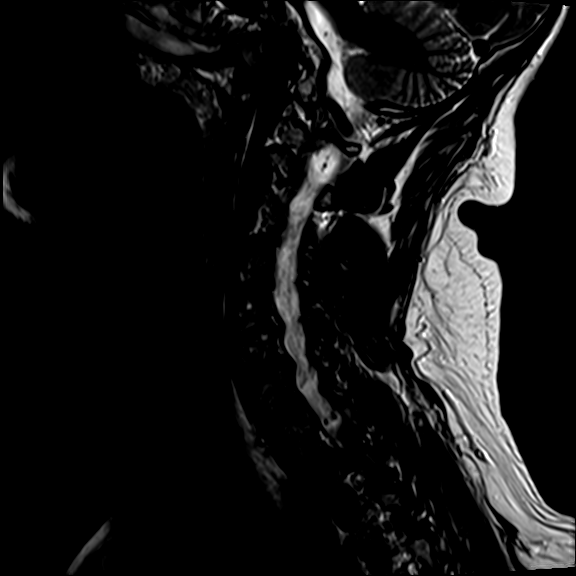
[im 15/15]
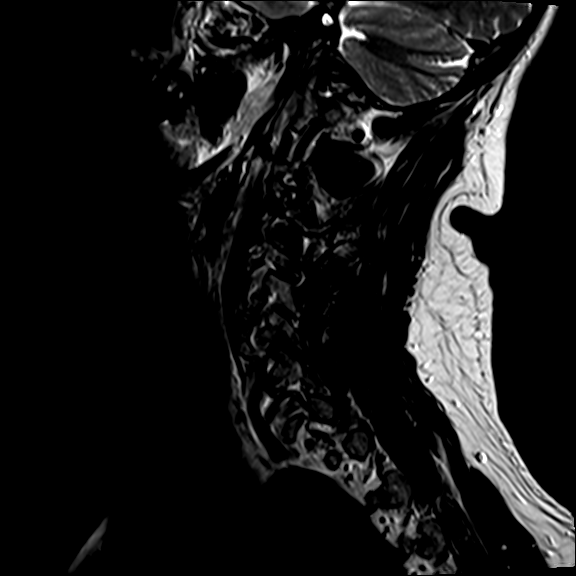

[T1 · sagittal · 3.0mm · 0.69mm/px · 5 of 15 slices shown]
[im 1/15]
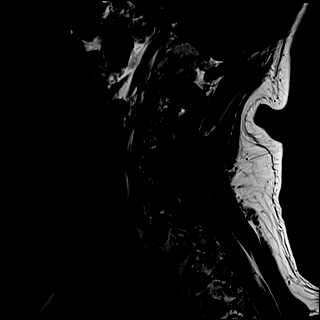
[im 4/15]
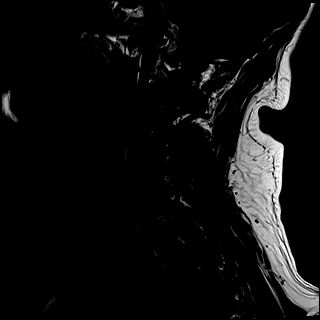
[im 8/15]
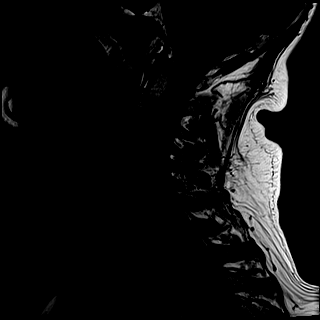
[im 11/15]
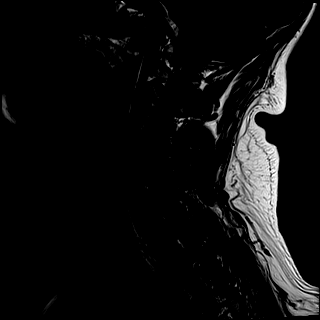
[im 15/15]
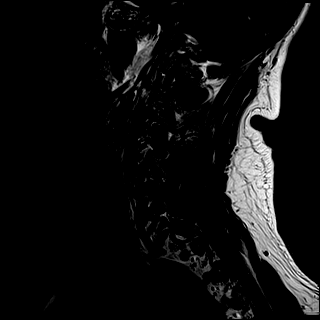

[STIR · sagittal · 3.0mm · 0.86mm/px · 5 of 15 slices shown]
[im 1/15]
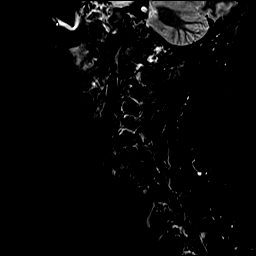
[im 4/15]
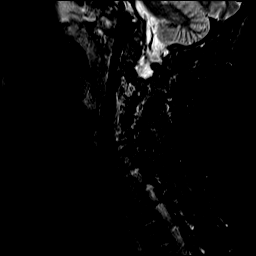
[im 8/15]
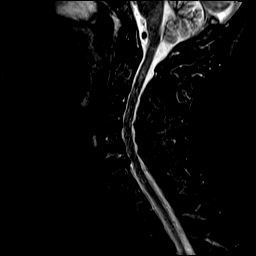
[im 11/15]
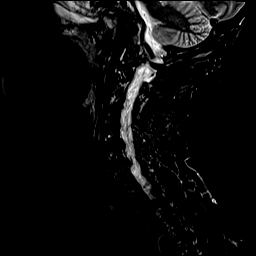
[im 15/15]
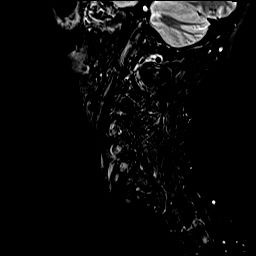

[GRE · axial · 3.0mm · 0.70mm/px · z∈[-209,-114]mm · 10 of 31 slices shown]
[im 1/31]
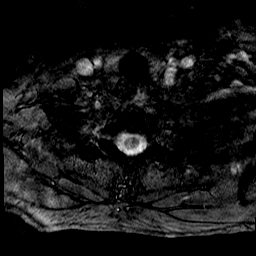
[im 4/31]
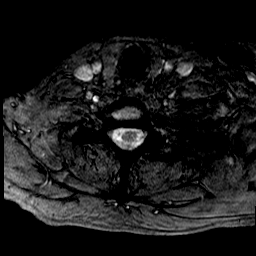
[im 7/31]
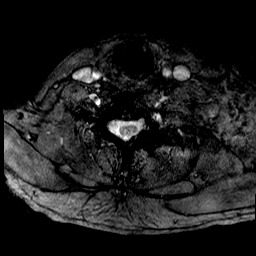
[im 11/31]
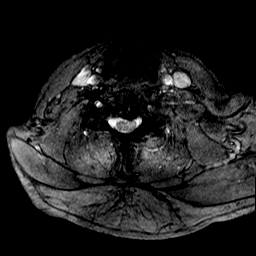
[im 14/31]
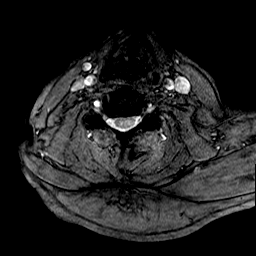
[im 17/31]
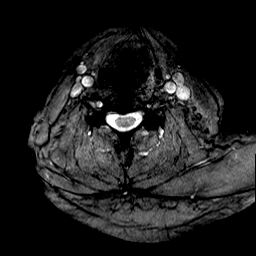
[im 21/31]
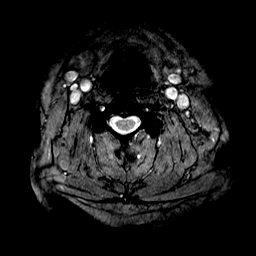
[im 24/31]
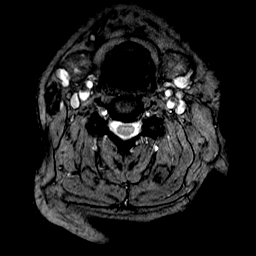
[im 27/31]
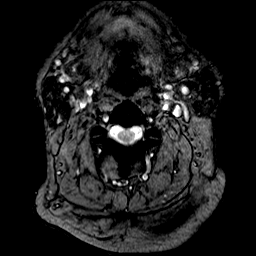
[im 31/31]
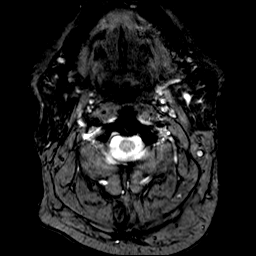

[T2 · axial · 2.0mm · 0.70mm/px · z∈[-207,-117]mm · 16 of 48 slices shown (2 of 2)]
[im 1/48]
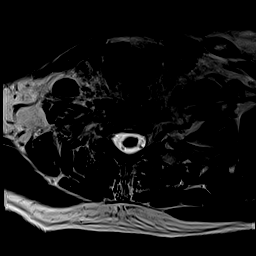
[im 4/48]
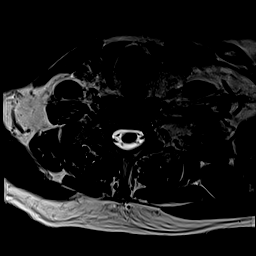
[im 7/48]
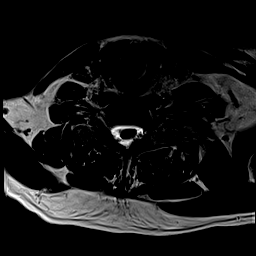
[im 10/48]
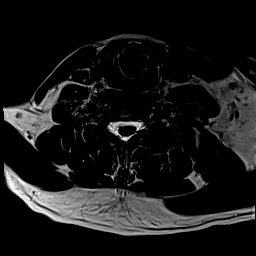
[im 13/48]
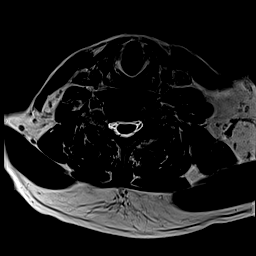
[im 16/48]
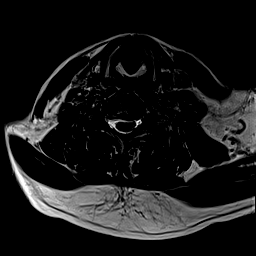
[im 19/48]
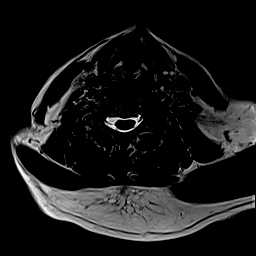
[im 22/48]
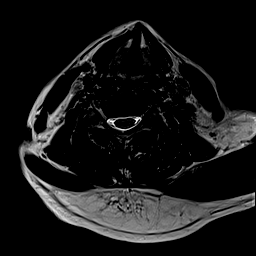
[im 26/48]
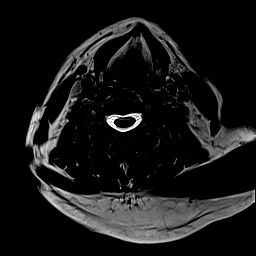
[im 29/48]
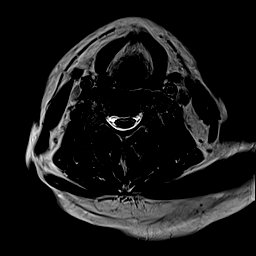
[im 32/48]
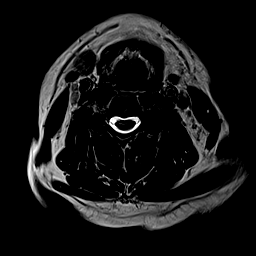
[im 35/48]
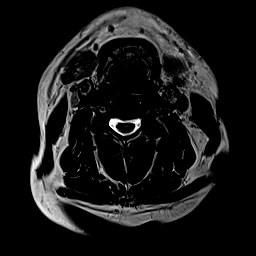
[im 38/48]
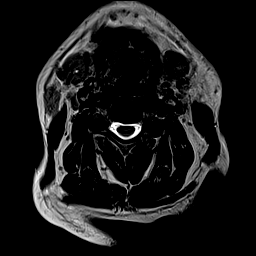
[im 41/48]
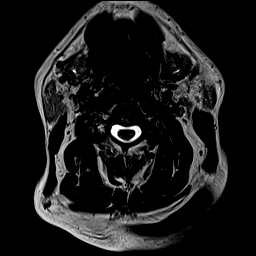
[im 44/48]
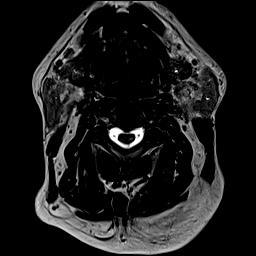
[im 48/48]
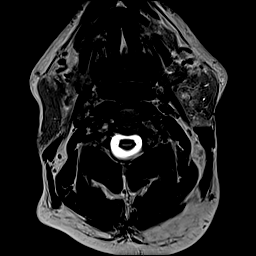

[48 of 48 positions shown; findings below may reference images not displayed]

FINDINGS: No aggressive marrow signal abnormality is seen. Cerebellar tonsils are not ectopic. Cervical cord is normal in course, caliber, and signal intensity. Prevertebral and paraspinal soft tissues are unremarkable.
C2-3: No significant spinal stenosis or neural foraminal narrowing.
C3-4: No significant spinal stenosis or neural foraminal narrowing.
C4-5: Central disc osteophyte complex with mild spinal stenosis but no neural foraminal narrowing
C5-6: Disc osteophyte complex with mild spinal stenosis. Mild left neural foraminal narrowing.
C6-7: Disc osteophyte complex with mild-to-moderate spinal stenosis. No neural foraminal narrowing
C7-T1: No spinal stenosis or neural foraminal narrowing
IMPRESSION: 
IMPRESSION: Degenerative changes with spinal stenosis and neural foraminal narrowing as described above level by level

## 2023-04-02 IMAGING — RF FL ESOPHAGUS BARIUM SWALLOW
11 of 13 series · 14 of 24 positions shown · non-contrast
Comparison: None

Reflux
Hernia
[DATE]min
136 air kerma
136 images
Discharge instructions given
Dr. Amazigh
FINAL REPORT:
AIR CONTRAST BARIUM SWALLOW
HISTORY: Diaphragmatic hernia without obstruction or gangrene
Gastro-esophageal reflux disease with esophagitis, without bleeding
TECHNIQUE: The patient is given thin consistencies of barium, effervescent crystals, and then thick barium and is imaged in both the frontal and lateral projections. The patient is also given a barium tablet and marshmallows for ingestion and is imaged.

[Series 1: swallow 4/s · 0.17mm/px · 2 of 20 frames shown (1 of 11)]
[frame 4/20]
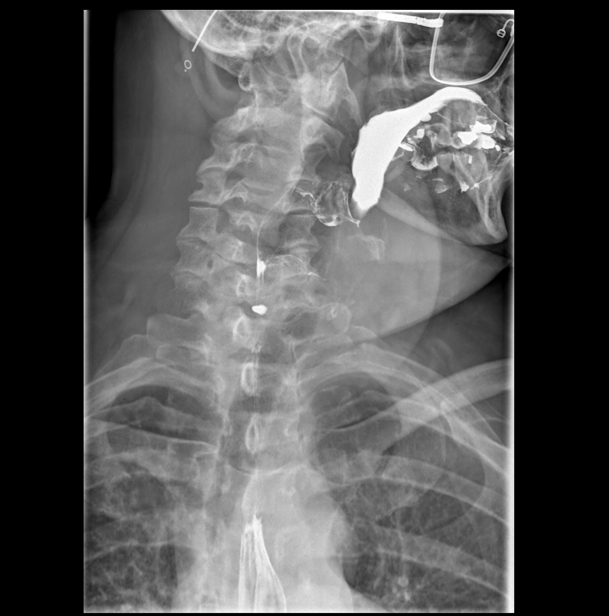
[frame 18/20]
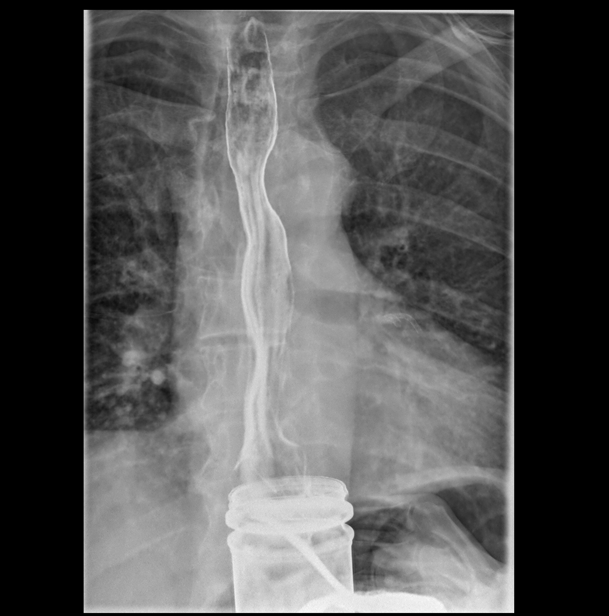

[Series 2: swallow 4/s · 0.17mm/px · 1 of 12 frames shown (2 of 11)]
[frame 7/12]
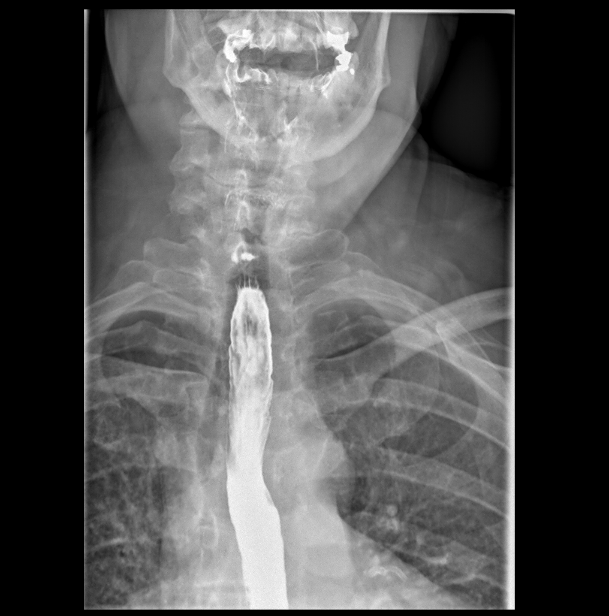

[Series 3: swallow 4/s · 0.17mm/px · 1 of 4 frames shown (3 of 11)]
[frame 3/4]
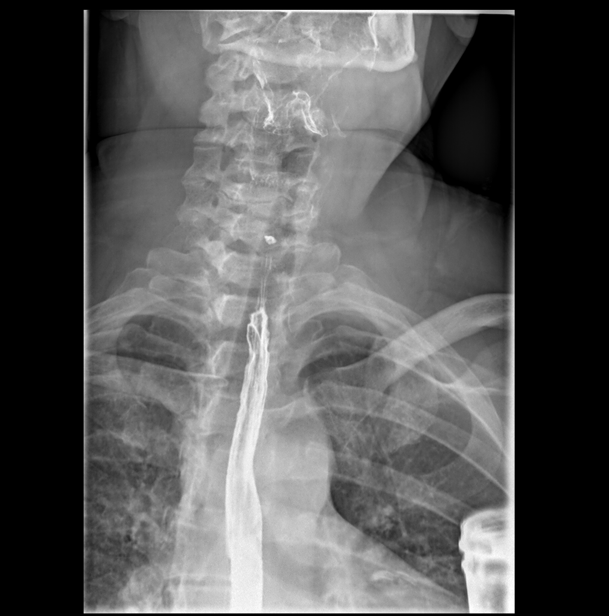

[Series 4: swallow 4/s · 0.17mm/px · 2 of 12 frames shown (4 of 11)]
[frame 2/12]
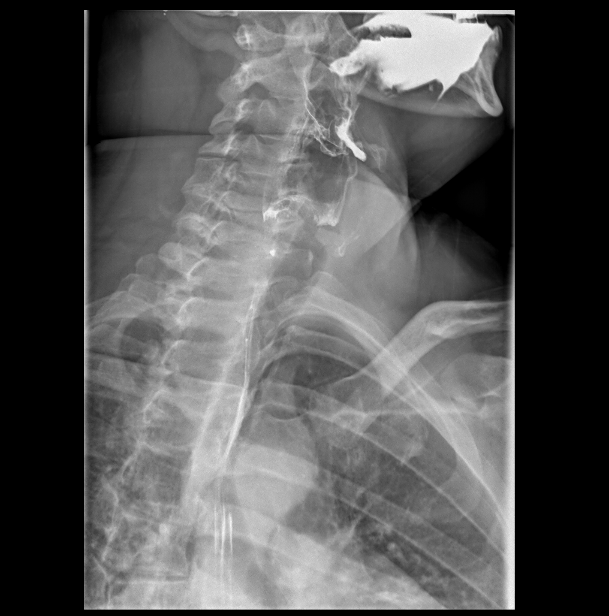
[frame 11/12]
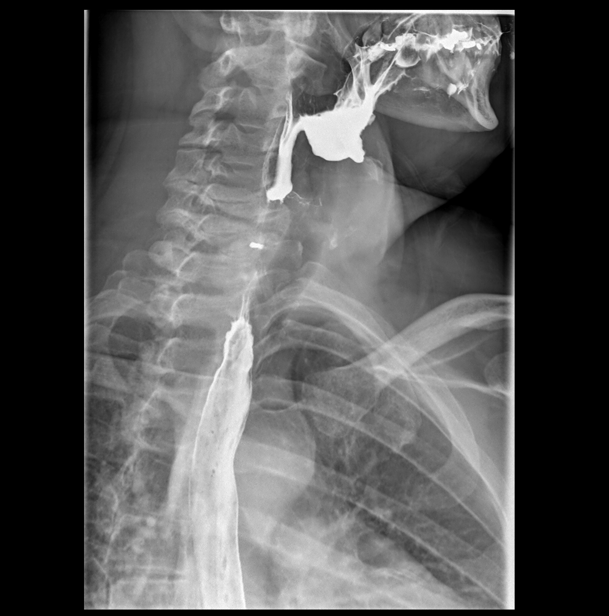

[Series 5: swallow 4/s · 0.17mm/px · 1 of 14 frames shown (5 of 11)]
[frame 8/14]
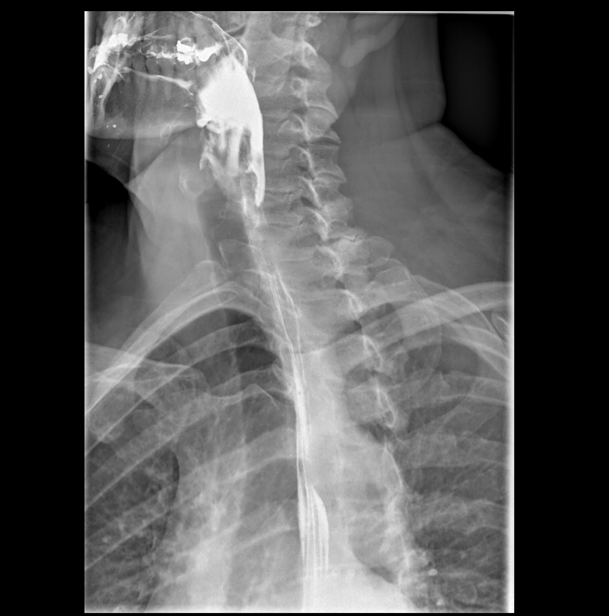

[Series 6: swallow 4/s · 0.25mm/px · 1 of 1 slices shown (6 of 11)]
[im 1/1]
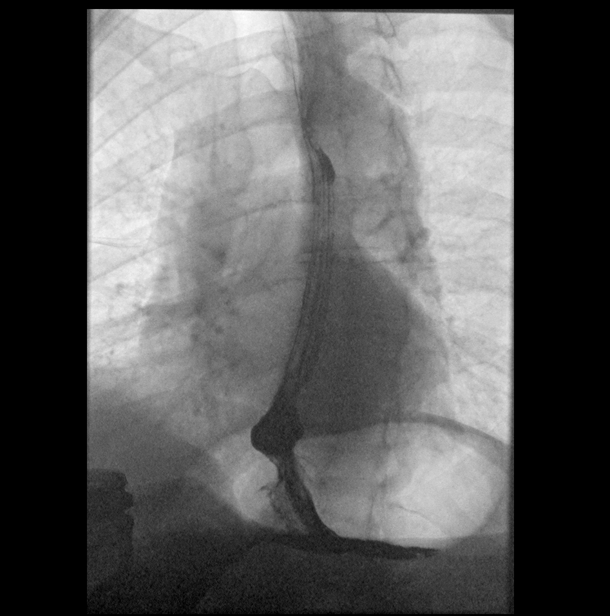

[Series 9: swallow 4/s · 0.17mm/px · 1 of 18 frames shown (7 of 11)]
[frame 3/18]
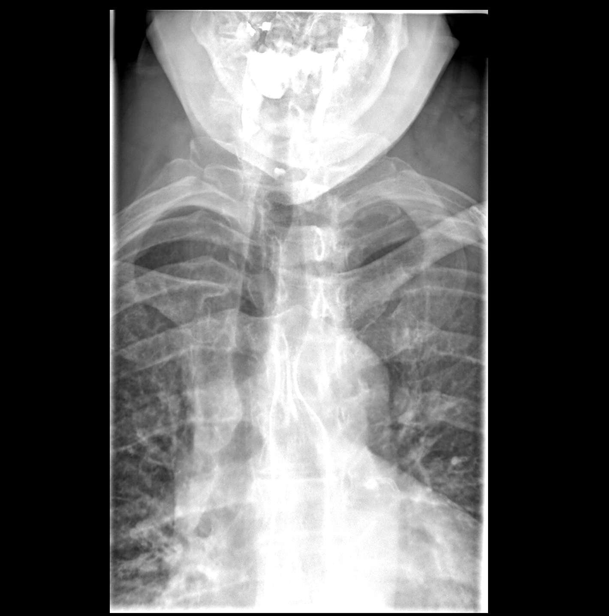

[Series 10: swallow 4/s · 0.25mm/px · 1 of 1 slices shown (8 of 11)]
[im 1/1]
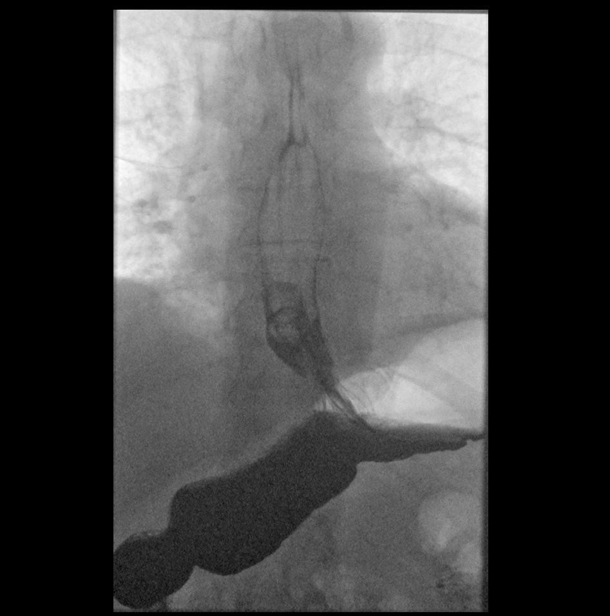

[Series 13: swallow 4/s · 0.25mm/px · 1 of 1 slices shown (9 of 11)]
[im 1/1]
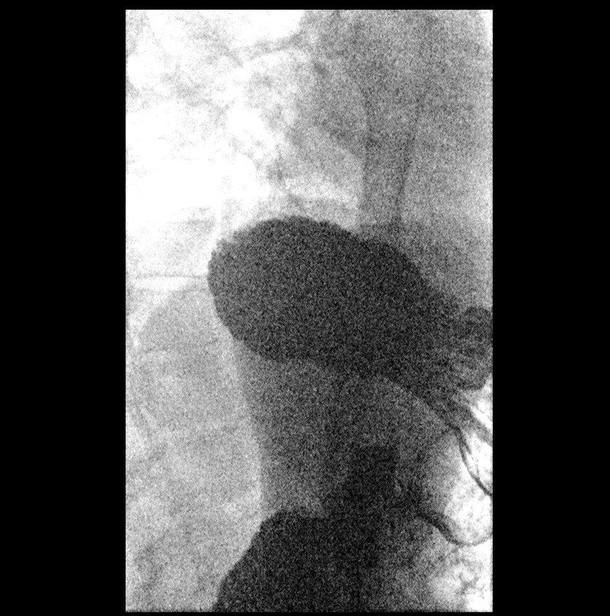

[Series 14: swallow 4/s · 0.25mm/px · 1 of 11 frames shown (10 of 11)]
[frame 6/11]
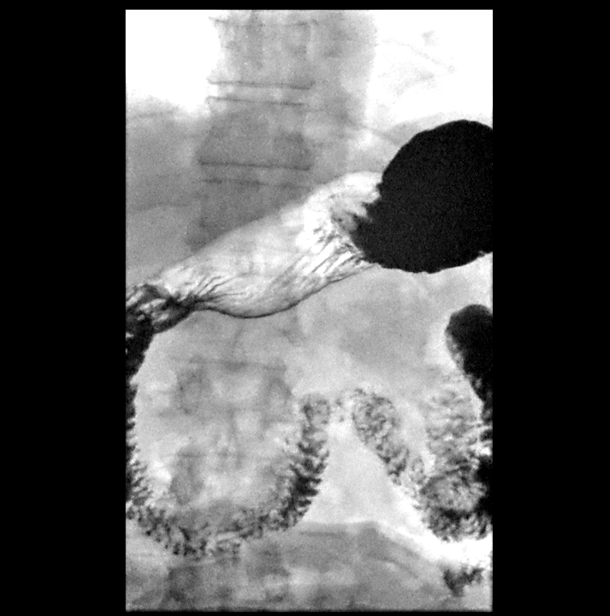

[Series 15: swallow 4/s · 0.27mm/px · 2 of 38 frames shown (11 of 11)]
[frame 6/38]
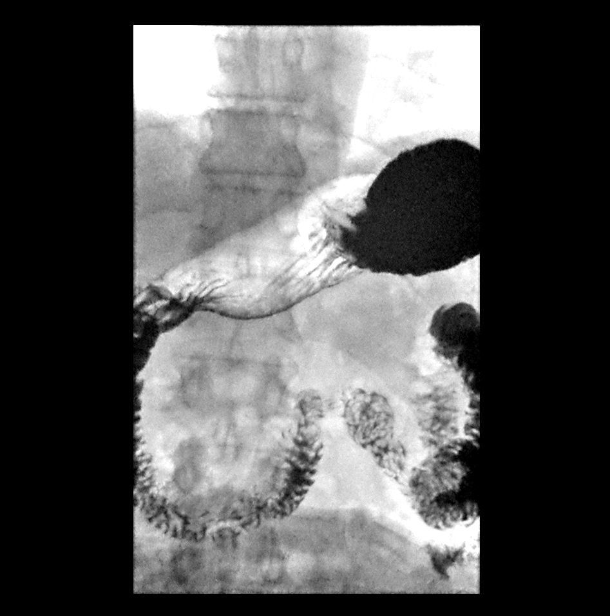
[frame 33/38]
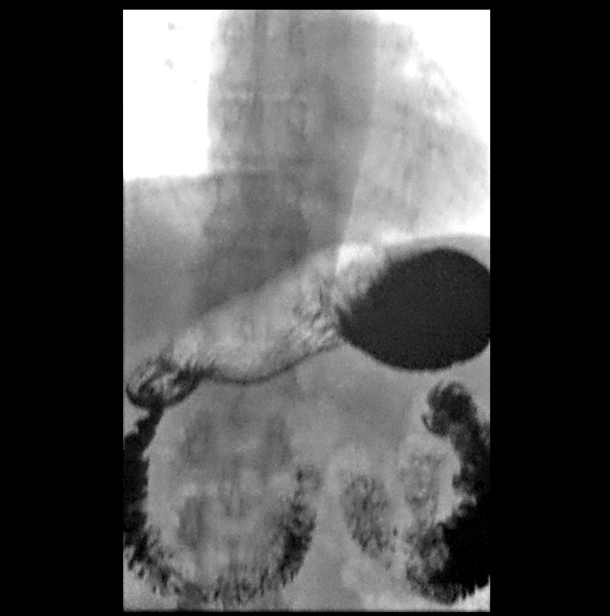

[14 of 24 positions shown; findings below may reference images not displayed]

FINDINGS: Air contrast barium swallow demonstrates normal swallowing function. Normal esophageal peristalsis. No esophageal strictures or obstructing lesions. No delay in barium tablet transit. There is a small amount of contrast which persists in the posterior esophageal wall likely a small diverticulum at the level of the approximately C7-T1 level. There is a small hiatal hernia.
Esophageal mucosal pattern appears unremarkable. No evidence of diverticulum or hiatal hernia.
With the patient in supine position, no evidence of spontaneous gastroesophageal reflux was identified.
Fluoroscopic time: 1.51 minutes
Fluoroscopic images: 136
Reference dose: Air kerma: 136 mGy
IMPRESSION: 
IMPRESSION: Moderate hiatal hernia with mild reflux
Probable small posterior esophageal diverticulum
